# Patient Record
Sex: Female | Born: 1935 | Race: Black or African American | Hispanic: No | Smoking: Never smoker
Health system: Southern US, Community
[De-identification: ages and names within clinical notes are randomized; demographics above are authoritative.]

## PROBLEM LIST (undated history)

## (undated) DIAGNOSIS — I10 Essential (primary) hypertension: Secondary | ICD-10-CM

## (undated) DIAGNOSIS — Z972 Presence of dental prosthetic device (complete) (partial): Secondary | ICD-10-CM

## (undated) DIAGNOSIS — M1990 Unspecified osteoarthritis, unspecified site: Secondary | ICD-10-CM

## (undated) DIAGNOSIS — E785 Hyperlipidemia, unspecified: Secondary | ICD-10-CM

## (undated) DIAGNOSIS — R42 Dizziness and giddiness: Secondary | ICD-10-CM

## (undated) HISTORY — DX: Hyperlipidemia, unspecified: E78.5

## (undated) HISTORY — DX: Essential (primary) hypertension: I10

---

## 1989-01-01 HISTORY — PX: VAGINAL HYSTERECTOMY: SUR661

## 2004-04-15 ENCOUNTER — Other Ambulatory Visit: Payer: Self-pay

## 2004-04-15 ENCOUNTER — Emergency Department: Payer: Self-pay | Admitting: Emergency Medicine

## 2006-01-22 ENCOUNTER — Ambulatory Visit: Payer: Self-pay | Admitting: Internal Medicine

## 2006-03-19 ENCOUNTER — Emergency Department: Payer: Self-pay | Admitting: Emergency Medicine

## 2006-03-20 ENCOUNTER — Other Ambulatory Visit: Payer: Self-pay

## 2006-05-21 ENCOUNTER — Ambulatory Visit: Payer: Self-pay | Admitting: Family Medicine

## 2006-05-22 ENCOUNTER — Ambulatory Visit: Payer: Self-pay | Admitting: Family Medicine

## 2006-07-19 ENCOUNTER — Other Ambulatory Visit: Payer: Self-pay

## 2006-07-19 ENCOUNTER — Emergency Department: Payer: Self-pay | Admitting: Emergency Medicine

## 2007-01-07 ENCOUNTER — Ambulatory Visit: Payer: Self-pay | Admitting: Gastroenterology

## 2007-07-22 ENCOUNTER — Ambulatory Visit: Payer: Self-pay | Admitting: Internal Medicine

## 2007-10-22 ENCOUNTER — Emergency Department: Payer: Self-pay | Admitting: Emergency Medicine

## 2009-01-01 HISTORY — PX: BREAST SURGERY: SHX581

## 2009-05-22 ENCOUNTER — Emergency Department: Payer: Self-pay | Admitting: Emergency Medicine

## 2009-05-26 ENCOUNTER — Ambulatory Visit: Payer: Self-pay | Admitting: Internal Medicine

## 2011-08-21 ENCOUNTER — Emergency Department: Payer: Self-pay | Admitting: Emergency Medicine

## 2011-08-21 LAB — COMPREHENSIVE METABOLIC PANEL
Albumin: 4 g/dL (ref 3.4–5.0)
Alkaline Phosphatase: 101 U/L (ref 50–136)
Anion Gap: 6 — ABNORMAL LOW (ref 7–16)
BUN: 15 mg/dL (ref 7–18)
Calcium, Total: 9.5 mg/dL (ref 8.5–10.1)
Co2: 31 mmol/L (ref 21–32)
Creatinine: 0.85 mg/dL (ref 0.60–1.30)
EGFR (Non-African Amer.): >60
Glucose: 105 mg/dL — ABNORMAL HIGH (ref 65–99)
Osmolality: 281 (ref 275–301)
SGPT (ALT): 32 U/L (ref 12–78)
Sodium: 140 mmol/L (ref 136–145)
Total Protein: 8.8 g/dL — ABNORMAL HIGH (ref 6.4–8.2)

## 2011-08-21 LAB — URINALYSIS, COMPLETE
Bilirubin,UR: NEGATIVE
Blood: NEGATIVE
Glucose,UR: NEGATIVE mg/dL (ref 0–75)
Nitrite: NEGATIVE
RBC,UR: NONE SEEN /HPF (ref 0–5)
Squamous Epithelial: <1

## 2011-08-21 LAB — CBC
HCT: 38.3 % (ref 35.0–47.0)
HGB: 13.3 g/dL (ref 12.0–16.0)
MCH: 33.5 pg (ref 26.0–34.0)
MCHC: 34.7 g/dL (ref 32.0–36.0)
MCV: 97 fL (ref 80–100)
RDW: 13.5 % (ref 11.5–14.5)

## 2011-08-21 LAB — TROPONIN I: Troponin-I: <0.02 ng/mL

## 2011-08-21 LAB — CK TOTAL AND CKMB (NOT AT ARMC)
CK, Total: 375 U/L — ABNORMAL HIGH (ref 21–215)
CK-MB: 4.1 ng/mL — ABNORMAL HIGH (ref 0.5–3.6)

## 2012-04-02 ENCOUNTER — Ambulatory Visit: Payer: Self-pay | Admitting: Gastroenterology

## 2012-06-06 ENCOUNTER — Emergency Department: Payer: Self-pay | Admitting: Emergency Medicine

## 2012-06-20 ENCOUNTER — Ambulatory Visit: Payer: Self-pay | Admitting: Internal Medicine

## 2012-12-17 ENCOUNTER — Emergency Department: Payer: Self-pay | Admitting: Emergency Medicine

## 2012-12-17 LAB — CBC
HCT: 39.4 % (ref 35.0–47.0)
HGB: 13.2 g/dL (ref 12.0–16.0)
MCHC: 33.6 g/dL (ref 32.0–36.0)
MCV: 93 fL (ref 80–100)
Platelet: 165 10*3/uL (ref 150–440)
RDW: 14 % (ref 11.5–14.5)
WBC: 11.1 10*3/uL — ABNORMAL HIGH (ref 3.6–11.0)

## 2012-12-18 LAB — BASIC METABOLIC PANEL
BUN: 22 mg/dL — ABNORMAL HIGH (ref 7–18)
Calcium, Total: 9.7 mg/dL (ref 8.5–10.1)
Co2: 27 mmol/L (ref 21–32)
EGFR (African American): >60
EGFR (Non-African Amer.): >60
Osmolality: 275 (ref 275–301)
Potassium: 3.9 mmol/L (ref 3.5–5.1)

## 2012-12-18 LAB — TROPONIN I: Troponin-I: <0.02 ng/mL

## 2012-12-23 ENCOUNTER — Ambulatory Visit: Payer: Self-pay | Admitting: Internal Medicine

## 2013-04-08 DIAGNOSIS — M1990 Unspecified osteoarthritis, unspecified site: Secondary | ICD-10-CM | POA: Insufficient documentation

## 2013-06-18 ENCOUNTER — Ambulatory Visit: Payer: Self-pay | Admitting: Internal Medicine

## 2014-01-06 ENCOUNTER — Ambulatory Visit: Payer: Self-pay | Admitting: Internal Medicine

## 2014-07-08 ENCOUNTER — Encounter: Payer: Self-pay | Admitting: Physical Therapy

## 2014-07-08 ENCOUNTER — Ambulatory Visit: Payer: Medicare Other | Attending: Obstetrics and Gynecology | Admitting: Physical Therapy

## 2014-07-08 VITALS — BP 120/70

## 2014-07-08 DIAGNOSIS — M629 Disorder of muscle, unspecified: Secondary | ICD-10-CM | POA: Diagnosis not present

## 2014-07-08 DIAGNOSIS — R293 Abnormal posture: Secondary | ICD-10-CM | POA: Insufficient documentation

## 2014-07-08 DIAGNOSIS — R279 Unspecified lack of coordination: Secondary | ICD-10-CM | POA: Diagnosis not present

## 2014-07-08 DIAGNOSIS — R102 Pelvic and perineal pain: Secondary | ICD-10-CM | POA: Insufficient documentation

## 2014-07-08 NOTE — Patient Instructions (Signed)
Proper sitting mechanics Further education on decreasing downward pressure of pelvic floor mm postponed to next session due to pt needing to leave early for husband's appt.

## 2014-07-09 NOTE — Therapy (Deleted)
[place] [facility] MAIN [facility] SERVICES [address] [place], [place], [address] Phone: [phone]   Fax:  [phone]  Physical Therapy Evaluation  Patient Details  Name: Kathy Morgan MRN: [id number] Date of Birth: 08-Oct-1934 Referring Provider:  Christeen Douglas, MD  Encounter Date: 07/08/2014      PT End of Session - 07/09/14 2245    Visit Number 1   Number of Visits 12   Date for PT Re-Evaluation 09/30/14   Authorization Type G code at 10th visit   PT Start Time 1410   PT Stop Time 1450   PT Time Calculation (min) 40 min   Activity Tolerance Other (comment)  no hands on assessment/Tx performed due to pt needing to leave early      Past Medical History  Diagnosis Date  . Hypertension   . Hyperlipidemia     Past Surgical History  Procedure Laterality Date  . Vaginal hysterectomy  1991  . Breast surgery Right 2011    R cyst removal (benign)     Filed Vitals:   07/08/14 1425  BP: 120/70    Visit Diagnosis:  Fascial defect - Plan: PT plan of care cert/re-cert  Lack of coordination - Plan: PT plan of care cert/re-cert  Poor posture - Plan: PT plan of care cert/re-cert      Subjective Assessment - 07/09/14 2236    Subjective Pt reported 2-3 months ago, she noticed pressure and uncomfortable sensation in suprapubic area, nocturia occuring 3x/night. Pt denied wearing pads nor leakage. Pt originally thought her nocturia was due to the side effects of her water pill that she takes for hypertension but she found the Sx to worsen. Pt is able delay urination for 5 min. Pt also experiences LBP. Pt plans to return to walking at mall daily (4-5 laps). Pt declined internal assessment of pelvic floor today but would agree to do it at next visit. Pt also expressed she had to end her session early due to the need to get to her husband's appt.     Patient is accompained by: Family member  husband   Pertinent History Hx of vaginal delivery,  unknown whether she had epsiotomy. Hemorrhoids after delivery of son. Daily bowel movements 2x. day,  Bristol Scale Type 3 with straining.  fluid: (3) 8 fl oz/ day, (2) cups of decaffeinated tea/ week. Hobbies: latin dancing   Patient Stated Goals stop the prolapse from getting worse and not to feel the "annoyance"     Pain Onset Other (comment)  ~80 yo on and off             [facility] PT Assessment - 07/09/14 2239    Assessment   Medical Diagnosis pelvic pain/ nocturia   Precautions   Precautions None   Restrictions   Weight Bearing Restrictions No   Observation/Other Assessments   Observations posterior tilt of pelvis, slight thoracic kyphosis  wearing flip flops w/ medial collapse of arches   Other Surveys  --  ODI and PFDI given. Pt had to leave early/ will return   Functional Tests   Functional tests --  pushing husband w/ WC w/ spinal flexion/hunched shoulders   Posture/Postural Control   Posture/Postural Control --  slumped sitting, ankles crossed under chair                 Pelvic Floor Special Questions - 07/09/14 2242    External Perineal Exam deferred for next visit due to pt needing to leave early  Pelvic Floor Internal Exam deferred for next visit due to pt needing to leave early and per pt's request          [facility] Adult PT Treatment/Exercise - 07[facility]08[facility]16 2242    Neuro Re-ed    Neuro Re-ed Details  proper sitting posture                 PT Education - 07/09/14 2244    Education provided Yes   Education Details sitting mechanics, anatomy and physiology, ways to decrease downward pressure of pelvic floor, goals, POC   Person(s) Educated Patient;Spouse   Methods Explanation;Demonstration;Tactile cues;Verbal cues   Comprehension Verbalized understanding;Returned demonstration             PT Long Term Goals - 07/09/14 2248    PT LONG TERM GOAL #1   Title Pt will report across a month her suprapubic pressure sensation will decrease from 4  x/month to < 2x month to increase QOL.    Time 12   Period Weeks   Status New   PT LONG TERM GOAL #2   Title Pt report decreased back pain from 25% to < 20% after bending over and picking up her purse from the floor 5 reps in order to increase particiaption w. ADLs.    Time 12   Period Weeks   Status New   PT LONG TERM GOAL #3   Title Pt will report decreased notcuria from 3x/night to < 2 x/ night in order to improve her sleep quality.    Time 12   Period Weeks   Status New   PT LONG TERM GOAL #4   Title Pt will report having bowel consistency of Bristol Scale Stool Type 3-4 with decreased straining > 75% of the time in order to perserve health/function of abdominopelvic area.    Time 12   Period Weeks   Status New               Plan - 07/09/14 2247    Clinical Impression Statement Pt is a 80 yo female whose S & Sx consist of posterior tilt of pelvis, slight thoracic kyphosis, poor sitting posture, Hx of straining w/bowel movements w/ harden stools, c/o of suprapubic pressure, LBP, and nocturia of 3x/night. These deficits interfere with her sleep quality and ability to bend over and pick items off floor.      Pt will benefit from skilled therapeutic intervention in order to improve on the following deficits Abnormal gait;Difficulty walking;Decreased range of motion;Decreased coordination;Decreased balance;Decreased activity tolerance;Decreased safety awareness;Decreased endurance;Pain;Impaired flexibility;Improper body mechanics;Decreased strength;Decreased mobility;Postural dysfunction;Increased muscle spasms   Rehab Potential Good   Clinical Impairments Affecting Rehab Potential age   PT Frequency 1x / week   PT Duration 12 weeks   PT Treatment/Interventions ADLs/Self Care Home Management;Moist Heat;Traction;Aquatic Therapy;Biofeedback;Electrical Stimulation;Cryotherapy;Stair training;Gait training;Therapeutic activities;Therapeutic exercise;Dry needling;Energy  conservation;Taping;Manual techniques;Neuromuscular re-education;Patient/family education;Functional mobility training;Passive range of motion;Balance training   PT Next Visit Plan spinal, abdominal , and pelvic internal assessment    Recommended Other Services Provided nutritional medical therapy resource [facility] to day to promote wellness and better management of high cholesterol/ blood pressure   Consulted and Agree with Plan of Care Patient;Family member/caregiver   Family Member Consulted husband          G-Codes - 07/09/14 2256    Functional Assessment Tool Used subjective history   Functional Limitation Self care;Mobility: Walking and moving around   Mobility: Walking and Moving Around Current Status [phone]([id number]) At least  20 percent but less than 40 percent impaired, limited or restricted   Mobility: Walking and Moving Around Goal Status [phone]) At least 1 percent but less than 20 percent impaired, limited or restricted   Self Care Current Status (X5284) At least 40 percent but less than 60 percent impaired, limited or restricted   Self Care Goal Status (X3244) At least 20 percent but less than 40 percent impaired, limited or restricted       Problem List There are no active problems to display for this patient.   Mariane Masters ,PT, DPT, E-RYT  07/09/2014, 11:00 PM  [place] [facility] MAIN [facility] SERVICES [address] [place], [place], [address] Phone: [phone]   Fax:  [phone]
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
## 2014-07-20 ENCOUNTER — Ambulatory Visit: Payer: Medicare Other | Admitting: Physical Therapy

## 2014-07-20 DIAGNOSIS — M629 Disorder of muscle, unspecified: Secondary | ICD-10-CM

## 2014-07-20 DIAGNOSIS — R293 Abnormal posture: Secondary | ICD-10-CM

## 2014-07-20 DIAGNOSIS — R279 Unspecified lack of coordination: Secondary | ICD-10-CM

## 2014-07-20 DIAGNOSIS — R102 Pelvic and perineal pain: Secondary | ICD-10-CM | POA: Diagnosis not present

## 2014-07-21 NOTE — Therapy (Deleted)
[facility] [facility] [address] [place], [place], [address] Phone: [phone]   Fax:  [phone]  Patient Details  Name: Kathy SalisburyRuby Morgan MRN: [id number] Date of Birth: 1934/04/16 Referring Provider:  Christeen DouglasBeasley, Bethany, MD  Encounter Date: 07/20/2014   Mariane MastersYeung,Shin Yiing 07/21/2014, 8:01 AM  [place] [facility] [address] [place], [place], [address] Phone: [phone]   Fax:  [phone]

## 2014-07-21 NOTE — Patient Instructions (Addendum)
   Hand out for log roll technique, Feet propped when toileting and breathing no straining to decrease worsening of prolapse  Pelvic floor long holds 5 sec,, count aloud, 3 breath rest break, 5x/ day with pillow under hips

## 2014-07-21 NOTE — Therapy (Signed)
[facility] MAIN [facility] SERVICES [address] [place], [place], [address] Phone: [phone]   Fax:  [phone]  Physical Therapy Treatment  Patient Details  Name: Angeleigh Chiasson MRN: [id number] Date of Birth: 1934-01-31 Referring Provider:  Christeen Douglas, MD  Encounter Date: 07/20/2014      PT End of Session - 07/21/14 0757    Visit Number 2   Number of Visits 12   Date for PT Re-Evaluation 09/30/14   Authorization Type G code at 10th visit   PT Start Time 0917   PT Stop Time 0950   PT Time Calculation (min) 33 min   Activity Tolerance Other (comment);Patient tolerated treatment well;No increased pain  no hands on assessment/Tx performed due to pt needing to leave early   Behavior During Therapy [facility] for tasks assessed/performed      Past Medical History  Diagnosis Date  . Hypertension   . Hyperlipidemia     Past Surgical History  Procedure Laterality Date  . Vaginal hysterectomy  1991  . Breast surgery Right 2011    R cyst removal (benign)     There were no vitals filed for this visit.  Visit Diagnosis:  Fascial defect  Lack of coordination  Poor posture      Subjective Assessment - 07/21/14 0755    Subjective Pt reported her suprapubic discomfort did not bother her after leaving last session. Her back has been hurting her. Pt stated she had to leave 15 min early today.    Pertinent History Hx of vaginal delivery, unknown whether she had epsiotomy. Hemorrhoids after delivery of son. Daily bowel movements 2x. day,  Bristol Scale Type 3 with straining.  fluid: (3) 8 fl oz/ day, (2) cups of decaffeinated tea/ week. Hobbies: latin dancing   Patient Stated Goals stop the prolapse from getting worse and not to feel the "annoyance"     Pain Onset Today  ~80 yo on and off             [facility] PT Assessment - 07/21/14 0752    Posture/Postural Control   Posture Comments noted bearing down w/ cue for bowel movement, pelvic  instability w/ le movements in supine   Palpation   Palpation comment soft abdomen, lacking tensigrity   Bed Mobility   Bed Mobility --  half crunch for OOB, cued for log roll                  Pelvic Floor Special Questions - 07/21/14 0748    Exam Type Vaginal  verbally consented w/o contraindications   Palpation observed abnormal position of bladder within introitus  required pillow for circumferential contraction   Strength # of reps 5   Strength # of seconds 5   Biofeedback required excessive coordination training, initially contracted w/ inhalation, valsalva  adductor overuse           [facility] Adult PT Treatment/Exercise - 07/21/14 4270    Therapeutic Activites    ADL's proper toileting posture to decrease straining   Neuro Re-ed    Neuro Re-ed Details  pelvic floor coordination for long holds   5 reps, 5 sec with 3 breath rest breaks, pillow under hips                PT Education - 07/21/14 0755    Education provided Yes   Education Details HEP   Person(s) Educated Patient   Methods Explanation;Demonstration;Tactile cues;Verbal cues;Handout   Comprehension Verbalized understanding;Returned demonstration  PT Long Term Goals - 07/09/14 2248    PT LONG TERM GOAL #1   Title Pt will report across a month her suprapubic pressure sensation will decrease from 4 x/month to < 2x month to increase QOL.    Time 12   Period Weeks   Status New   PT LONG TERM GOAL #2   Title Pt report decreased back pain from 25% to < 20% after bending over and picking up grocery bag of 8#  from the floor 5 reps in order to increase particiaption w. ADLs.    Time 12   Period Weeks   Status New   PT LONG TERM GOAL #3   Title Pt will report decreased nocturia from 3x/night to < 2 x/ night in order to improve her sleep quality.    Time 12   Period Weeks   Status New   PT LONG TERM GOAL #4   Title Pt will report having bowel consistency of Bristol Scale  Stool Type 3-4 with decreased straining > 75% of the time in order to perserve health/function of abdominopelvic area.    Time 12   Period Weeks   Status New   PT LONG TERM GOAL #5   Title Pt will demo deep core activation with floor to rise (low lunge) and UE support 3 reps without cuing in order to demo improved balance and functional mobility/strength.   Time 12   Period Weeks   Status New               Plan - 07/21/14 0757    Clinical Impression Statement Despite abbreviated session today due to pt's late arrival and need to leave early, pt was able to demo proper pelvic floor coordination for 5 hold holds, 5 reps at 3/5 strength with pillow propped to faciliate better positioning of pelvic organs. Reviewed ways to decrease downward pressure on pelvic floor. Plan to address deep core strengthening/ pelvic tilts to address back pain at next session.     Pt will benefit from skilled therapeutic intervention in order to improve on the following deficits Abnormal gait;Difficulty walking;Decreased range of motion;Decreased coordination;Decreased balance;Decreased activity tolerance;Decreased safety awareness;Decreased endurance;Pain;Impaired flexibility;Improper body mechanics;Decreased strength;Decreased mobility;Postural dysfunction;Increased muscle spasms   Rehab Potential Good   Clinical Impairments Affecting Rehab Potential age   PT Frequency 1x / week   PT Duration 12 weeks   PT Treatment/Interventions ADLs/Self Care Home Management;Moist Heat;Traction;Aquatic Therapy;Biofeedback;Electrical Stimulation;Cryotherapy;Stair training;Gait training;Therapeutic activities;Therapeutic exercise;Dry needling;Energy conservation;Taping;Manual techniques;Neuromuscular re-education;Patient/family education;Functional mobility training;Passive range of motion;Balance training   PT Next Visit Plan deep core strengthening and abdominal massage    Consulted and Agree with Plan of Care Patient         Problem List There are no active problems to display for this patient.   Mariane MastersYeung,Shin Yiing  ,PT, DPT, E-RYT  07/21/2014, 8:02 AM  [place] [facility] [address] [place], [place], [address] Phone: [phone]   Fax:  [phone]

## 2014-07-27 ENCOUNTER — Ambulatory Visit: Payer: Medicare Other | Admitting: Physical Therapy

## 2014-07-27 DIAGNOSIS — R102 Pelvic and perineal pain: Secondary | ICD-10-CM | POA: Diagnosis not present

## 2014-07-27 DIAGNOSIS — M629 Disorder of muscle, unspecified: Secondary | ICD-10-CM

## 2014-07-27 DIAGNOSIS — R279 Unspecified lack of coordination: Secondary | ICD-10-CM

## 2014-07-27 DIAGNOSIS — R293 Abnormal posture: Secondary | ICD-10-CM

## 2014-07-28 NOTE — Therapy (Signed)
[place] MAIN [facility] SERVICES [address] [place], [place], [address] Phone: [phone]   Fax:  [phone]  Physical Therapy Treatment  Patient Details  Name: Kathy Morgan MRN: [id number] Date of Birth: [birth year] Referring Provider:  Benjaman Kindler, MD  Encounter Date: 07/27/2014      PT End of Session - 07/28/14 1344    Visit Number 2   Number of Visits 12   Date for PT Re-Evaluation 09/30/14   Authorization Type G code at 10th visit   PT Start Time 0911   PT Stop Time 1000   PT Time Calculation (min) 49 min   Activity Tolerance Other (comment);Patient tolerated treatment well;No increased pain  no hands on assessment/Tx performed due to pt needing to leave early   Behavior During Therapy [facility] for tasks assessed/performed      Past Medical History  Diagnosis Date  . Hypertension   . Hyperlipidemia     Past Surgical History  Procedure Laterality Date  . Vaginal hysterectomy  1991  . Breast surgery Right 2011    R cyst removal (benign)     There were no vitals filed for this visit.  Visit Diagnosis:  Fascial defect  Lack of coordination  Poor posture      Subjective Assessment - 07/27/14 0914    Subjective Pt felt her back has been good except for one day.  Pt's pelvic pain has resolved for the past week and has not been waking her up at night nor lingering through the day. She states it is not as bad. Pt feels better overall and finds herself "being able to get through the day."    Pertinent History Hx of vaginal delivery, unknown whether she had epsiotomy. Hemorrhoids after delivery of son. Daily bowel movements 2x. day,  Bristol Scale Type 3 with straining.  fluid: (3) 8 fl oz/ day, (2) cups of decaffeinated tea/ week. Hobbies: latin dancing   Patient Stated Goals stop the prolapse from getting worse and not to feel the "annoyance"     Currently in Pain? No/denies   Pain Location Back   Pain Onset Today  ~80 yo  on and off                       Pelvic Floor Special Questions - 07/27/14 1005    Exam Type Vaginal  verbally consented w/o contraindications   Palpation --  circumferential contraction w/o pillow under  hips   Strength # of reps 3   Strength # of seconds 7   Biofeedback improved coordination compared to last visit. decreased cuing           [facility] Adult PT Treatment/Exercise - 07/28/14 1340    Neuro Re-ed    Neuro Re-ed Details  dynamic stabilization 1-2, 10 reps  pillow under hips   Exercises   Exercises --  Pelvic floor holds 7 sec, 3 reps pillow under hips                PT Education - 07/28/14 1342    Education provided Yes   Education Details HEP, progression of POC   Person(s) Educated Patient   Methods Explanation;Demonstration;Tactile cues;Verbal cues   Comprehension Verbalized understanding;Returned demonstration             PT Long Term Goals - 07/28/14 1343    PT LONG TERM GOAL #1   Title Pt will report across a month her suprapubic pressure sensation will  decrease from 4 x/month to < 2x month to increase QOL.    Time 12   Period Weeks   Status Partially Met   PT LONG TERM GOAL #2   Title Pt report decreased back pain from 25% to < 20% after bending over and picking up grocery bag of 8#  from the floor 5 reps in order to increase particiaption w. ADLs.    Time 12   Period Weeks   Status On-going   PT LONG TERM GOAL #3   Title Pt will report decreased nocturia from 3x/night to < 2 x/ night in order to improve her sleep quality.    Time 12   Period Weeks   Status On-going   PT LONG TERM GOAL #4   Title Pt will report having bowel consistency of Bristol Scale Stool Type 3-4 with decreased straining > 75% of the time in order to perserve health/function of abdominopelvic area.    Time 12   Period Weeks   Status On-going   PT LONG TERM GOAL #5   Title Pt will demo deep core activation with floor to rise (low lunge) and UE  support 3 reps without cuing in order to demo improved balance and functional mobility/strength.   Time 12   Period Weeks   Status New               Plan - 07/28/14 1354    Clinical Impression Statement Pt continues to progress well with resolution of pelvic discomfort for the past week.  Pt showed increased pelvic floor strength as she demo'd ability to perform circumferential contraction without a pillow under her hips today. Pt has progressed to dynamic stabiliazation 1-2 and longer endurance pelvic floor holds. Pt will continue to benefit from skilled PT.   Pt will benefit from skilled therapeutic intervention in order to improve on the following deficits Abnormal gait;Difficulty walking;Decreased range of motion;Decreased coordination;Decreased balance;Decreased activity tolerance;Decreased safety awareness;Decreased endurance;Pain;Impaired flexibility;Improper body mechanics;Decreased strength;Decreased mobility;Postural dysfunction;Increased muscle spasms   Rehab Potential Good   Clinical Impairments Affecting Rehab Potential age   PT Frequency 1x / week   PT Duration 12 weeks   PT Treatment/Interventions ADLs/Self Care Home Management;Moist Heat;Traction;Aquatic Therapy;Biofeedback;Electrical Stimulation;Cryotherapy;Stair training;Gait training;Therapeutic activities;Therapeutic exercise;Dry needling;Energy conservation;Taping;Manual techniques;Neuromuscular re-education;Patient/family education;Functional mobility training;Passive range of motion;Balance training   PT Next Visit Plan abdominal massage   Consulted and Agree with Plan of Care Patient        Problem List There are no active problems to display for this patient.   Sara Chu, DPT, E-RYT   07/28/2014, 1:59 PM  [place] [facility] MAIN [facility] SERVICES [address] [place], [place], [address] Phone: [phone]   Fax:  [phone]

## 2014-07-28 NOTE — Patient Instructions (Signed)
Pelvic floor -7 sec, 3reps, 7 x/ day  Perform Dynamic 1-2 this week.  You are now ready to begin training the deep core muscles system: diaphragm, transverse abdominis, pelvic floor . These muscles must work together as a team.           The key to these exercises to train the brain to coordinate the timing of these muscles and to have them turn on for long periods of time to hold you upright against gravity (especially important if you are on your feet all day).These muscles are postural muscles and play a role stabilizing your spine and bodyweight. By doing these repetitions slowly and correctly instead of doing crunches, you will achieve a flatter belly without a lower pooch. You are also placing your spine in a more neutral position and breathing properly which in turn, decreases your risk for problems related to your pelvic floor, abdominal, and low back such as pelvic organ prolapse, hernias, diastasis recti (separation of superficial muscles), disk herniations, spinal fractures. These exercises set a solid foundation for you to later progress to resistance/ strength training with therabands and weights and return to other typical fitness exercises with a stronger deeper core.

## 2014-08-03 ENCOUNTER — Ambulatory Visit: Payer: Medicare Other | Attending: Obstetrics and Gynecology | Admitting: Physical Therapy

## 2014-08-03 ENCOUNTER — Encounter: Payer: Medicare Other | Admitting: Physical Therapy

## 2014-08-03 VITALS — BP 110/72

## 2014-08-03 DIAGNOSIS — R293 Abnormal posture: Secondary | ICD-10-CM

## 2014-08-03 DIAGNOSIS — R279 Unspecified lack of coordination: Secondary | ICD-10-CM | POA: Diagnosis present

## 2014-08-03 DIAGNOSIS — M629 Disorder of muscle, unspecified: Secondary | ICD-10-CM | POA: Insufficient documentation

## 2014-08-03 NOTE — Patient Instructions (Signed)
Open book  Standing by wall, elbows pushing into wall (shoulder retraction) 5 sec w/ pelvic floor lift  Seated, shoulder away from ears, elbows straight, palms into seat )shoulder retraction 5 sec w/ pelvic floor lift  Pelvic floor quick  10x/ 3 sets/ day for all the above

## 2014-08-04 ENCOUNTER — Encounter: Payer: Medicare Other | Admitting: Physical Therapy

## 2014-08-04 NOTE — Therapy (Signed)
[place] MAIN [facility] SERVICES [address] [place], [place], [address] Phone: [phone]   Fax:  [phone]  Physical Therapy Treatment  Patient Details  Name: Kathy Morgan MRN: [id number] Date of Birth: 08-18-1934 Referring Provider:  Benjaman Kindler, MD  Encounter Date: 08/03/2014      PT End of Session - 08/03/14 1015    Visit Number 3   Number of Visits 12   Date for PT Re-Evaluation 09/30/14   Authorization Type G code at 10th visit   PT Start Time 0900   PT Stop Time 1008   PT Time Calculation (min) 68 min   Activity Tolerance Other (comment);Patient tolerated treatment well;No increased pain  no hands on assessment/Tx performed due to pt needing to leave early   Behavior During Therapy [facility] for tasks assessed/performed      Past Medical History  Diagnosis Date  . Hypertension   . Hyperlipidemia     Past Surgical History  Procedure Laterality Date  . Vaginal hysterectomy  1991  . Breast surgery Right 2011    R cyst removal (benign)     Filed Vitals:   08/03/14 0927  BP: 110/72    Visit Diagnosis:  No diagnosis found.      Subjective Assessment - 08/03/14 0907    Subjective Pt reported she feeling "fine" with pelvic area for the past 2 weeks with 90% improvement. Pt states 85-90% improvement with her back pain. Pt states she had experienced 5/10 discomfort aching on the shoulder blade for 4 days with a 2/10 today.  Pt relieved the discomfort with icy hot. Pt staes she has been experiencing stress in her life as her husband and sister-in-law have diagnoses of a life-threatening disease. Pt denied jaw/hand pain , chest pain, nuimbness /tingling down arm, SOB.  Pt has been  lifting bags and noticed some strain. Pt also sometimes  pushes her husband in a WC and has to transfer the [facility] out of the car.     Pertinent History Hx of vaginal delivery, unknown whether she had epsiotomy. Hemorrhoids after delivery of son. Daily  bowel movements 2x. day,  Bristol Scale Type 3 with straining.  fluid: (3) 8 fl oz/ day, (2) cups of decaffeinated tea/ week. Hobbies: latin dancing   Patient Stated Goals stop the prolapse from getting worse and not to feel the "annoyance"     Currently in Pain? Yes   Pain Score 2    Pain Location Shoulder   Pain Orientation Left   Pain Descriptors / Indicators Aching   Pain Onset Today  ~80 yo on and off                          [facility] Adult PT Treatment/Exercise - 08/04/14 0909    Posture/Postural Control   Posture Comments cervical/scap retraction by wall (elbow press), and seated w/ shoulder depression on seat, with deep core activation 10 reps   seated pelvic floor quick holds 5x, 5 x/ day   Self-Care   Other Self-Care Comments  body scan    Neuro Re-ed    Neuro Re-ed Details  cue for depression of ribcage w/ exhalation, less    Exercises   Exercises --  open book bilateral   Manual Therapy   Joint Mobilization Grade II T7, 9-12 90 sec bout. Increased mobility post-Tx   Soft tissue mobilization R middle thoracic mm > L, upper trap mm   decreased tensions/  tenderness post Tx   Myofascial Release bilateral pects   Scapular Mobilization bilateral in sidelying                PT Education - 08/04/14 1005    Education provided Yes   Education Details HEP, use of body scan for stress management    Person(s) Educated Patient   Methods Explanation;Demonstration;Tactile cues;Verbal cues;Handout   Comprehension Verbalized understanding;Returned demonstration             PT Long Term Goals - 07/28/14 1343    PT LONG TERM GOAL #1   Title Pt will report across a month her suprapubic pressure sensation will decrease from 4 x/month to < 2x month to increase QOL.    Time 12   Period Weeks   Status Partially Met   PT LONG TERM GOAL #2   Title Pt report decreased back pain from 25% to < 20% after bending over and picking up grocery bag of 8#  from the  floor 5 reps in order to increase particiaption w. ADLs.    Time 12   Period Weeks   Status On-going   PT LONG TERM GOAL #3   Title Pt will report decreased nocturia from 3x/night to < 2 x/ night in order to improve her sleep quality.    Time 12   Period Weeks   Status On-going   PT LONG TERM GOAL #4   Title Pt will report having bowel consistency of Bristol Scale Stool Type 3-4 with decreased straining > 75% of the time in order to perserve health/function of abdominopelvic area.    Time 12   Period Weeks   Status On-going   PT LONG TERM GOAL #5   Title Pt will demo deep core activation with floor to rise (low lunge) and UE support 3 reps without cuing in order to demo improved balance and functional mobility/strength.   Time 12   Period Weeks   Status New               Plan - 08/04/14 1008    Clinical Impression Statement Pt continues to progress well with no pain post-Tx. Pt's mm imbalances in her midback/ upper traps are assoicated with her tendency to elevate her upper traps w/ breathing and slumped posture. Pdemo'd improved deep core coordination w/ posture training/HEP. Pt will continue to benefit from skilled PT to address deep core strengthening and functional strengthening.     Pt will benefit from skilled therapeutic intervention in order to improve on the following deficits Abnormal gait;Difficulty walking;Decreased range of motion;Decreased coordination;Decreased balance;Decreased activity tolerance;Decreased safety awareness;Decreased endurance;Pain;Impaired flexibility;Improper body mechanics;Decreased strength;Decreased mobility;Postural dysfunction;Increased muscle spasms   Rehab Potential Good   Clinical Impairments Affecting Rehab Potential age   PT Frequency 1x / week   PT Duration 12 weeks   PT Treatment/Interventions ADLs/Self Care Home Management;Moist Heat;Traction;Aquatic Therapy;Biofeedback;Electrical Stimulation;Cryotherapy;Stair training;Gait  training;Therapeutic activities;Therapeutic exercise;Dry needling;Energy conservation;Taping;Manual techniques;Neuromuscular re-education;Patient/family education;Functional mobility training;Passive range of motion;Balance training   PT Next Visit Plan abdominal massage, WC t/f,band exercises   Consulted and Agree with Plan of Care Patient        Problem List There are no active problems to display for this patient.   Jerl Mina ,PT, DPT, E-RYT  08/04/2014, 11:56 AM  [place] MAIN [facility] SERVICES [address] [place], [place], [address] Phone: [phone]   Fax:  [phone]

## 2014-08-10 ENCOUNTER — Encounter: Payer: Medicare Other | Admitting: Physical Therapy

## 2014-08-11 ENCOUNTER — Ambulatory Visit: Payer: Medicare Other | Admitting: Physical Therapy

## 2014-08-11 DIAGNOSIS — R293 Abnormal posture: Secondary | ICD-10-CM

## 2014-08-11 DIAGNOSIS — M629 Disorder of muscle, unspecified: Secondary | ICD-10-CM | POA: Diagnosis not present

## 2014-08-11 DIAGNOSIS — R279 Unspecified lack of coordination: Secondary | ICD-10-CM

## 2014-08-11 NOTE — Patient Instructions (Addendum)
Strengthening routine:  Start in proper sitting posture (slight arch back, feet on floor, weight on sitting bones not tailbone)           Seated pelvic floor 7 sec holds, x 5 reps, 2 sets at 3 x during the day (8:00-noon, noon-4pm, 4-8pm) == total 30 sets/ day  Laying on back with knees bent 10x 2 sets    1. Dynamic Stabilization 3: marching  2. Dynamic Stabilization 4: e leg straightens with opposite arm overhead 10x 3 sets   3. Yellow band under heels, elbows down dig into bed, shoulder blades back, on exhale, pull band into "w"    In hallway:  2-3 roundtrips   Side step > squat (see your knees) > inhale on exhale you stand > elbow by ribs and exhale with pulling apart  Yellow band   [place]: moving to right, left foot circle front, sidestep, left foot circles back behind...the whole hallway                    Move left with right foot circles infront and the side step and right foot behind....    To relieve the back, small pelvic tilts to feel the closure of the joint in the back

## 2014-08-12 ENCOUNTER — Encounter: Payer: Medicare Other | Admitting: Physical Therapy

## 2014-08-12 NOTE — Therapy (Signed)
[place] [facility] MAIN [facility] SERVICES [address] [place], [place], [address] Phone: [phone]   Fax:  [phone]  Physical Therapy Treatment  Patient Details  Name: Kathy Morgan MRN: [id number] Date of Birth: Oct 26, 1934 Referring Provider:  Christeen Douglas, MD  Encounter Date: 08/11/2014      PT End of Session - 08/12/14 0811    Visit Number 4   Number of Visits 12   Date for PT Re-Evaluation 09/30/14   Authorization Type G code at 10th visit   PT Start Time 1100   PT Stop Time 1200   PT Time Calculation (min) 60 min   Activity Tolerance Other (comment);Patient tolerated treatment well;No increased pain  no hands on assessment/Tx performed due to pt needing to leave early   Behavior During Therapy [facility] for tasks assessed/performed      Past Medical History  Diagnosis Date  . Hypertension   . Hyperlipidemia     Past Surgical History  Procedure Laterality Date  . Vaginal hysterectomy  1991  . Breast surgery Right 2011    R cyst removal (benign)     There were no vitals filed for this visit.  Visit Diagnosis:  Fascial defect  Lack of coordination  Poor posture      Subjective Assessment - 08/11/14 1105    Subjective Pt reported she continued to have no discomfort all week since last session in the pelvic area. Pt was able to go on a road trip to [place], [place] with friends on a bus and enjoyed herself without experiencing any Sx. Yesterday, pt experienced minor back pain 3/10 which resolved w/ application of icey hot. This  morning pt experiences 4/10 back pain.      Pertinent History Hx of vaginal delivery, unknown whether she had epsiotomy. Hemorrhoids after delivery of son. Daily bowel movements 2x. day,  Bristol Scale Type 3 with straining.  fluid: (3) 8 fl oz/ day, (2) cups of decaffeinated tea/ week. Hobbies: latin dancing   Patient Stated Goals stop the prolapse from getting worse and not to feel the "annoyance"     Currently in Pain? Yes   Pain Score 4    Pain Location Back   Pain Onset Today  ~80 yo on and off                          [facility] Adult PT Treatment/Exercise - 08/12/14 0810    Exercises   Exercises --  see pt instructions   Manual Therapy   Joint Mobilization manually applied force closure                 PT Education - 08/12/14 0811    Education provided Yes   Education Details HEP   Person(s) Educated Patient   Methods Explanation;Demonstration;Tactile cues;Verbal cues;Handout   Comprehension Verbalized understanding;Returned demonstration             PT Long Term Goals - 08/12/14 6962    PT LONG TERM GOAL #1   Title Pt will report across a month her suprapubic pressure sensation will decrease from 4 x/month to < 2x month to increase QOL.    Time 12   Period Weeks   Status Achieved   PT LONG TERM GOAL #2   Title Pt report decreased back pain from 25% to < 20% after bending over and picking up grocery bag of 8#  from the floor 5 reps in order to increase particiaption  w. ADLs.    Time 12   Period Weeks   Status On-going   PT LONG TERM GOAL #3   Title Pt will report decreased nocturia from 3x/night to < 2 x/ night in order to improve her sleep quality.    Time 12   Period Weeks   Status On-going   PT LONG TERM GOAL #4   Title Pt will report having bowel consistency of Bristol Scale Stool Type 3-4 with decreased straining > 75% of the time in order to perserve health/function of abdominopelvic area.    Time 12   Period Weeks   Status On-going   PT LONG TERM GOAL #5   Title Pt will demo deep core activation with floor to rise (low lunge) and UE support 3 reps without cuing in order to demo improved balance and functional mobility/strength.   Time 12   Period Weeks   Status New               Plan - 08/12/14 4696    Clinical Impression Statement Pt is progress well with resolved pelvic discomfort for the past month. Pt's back  pain decreased from 4/10 to 2/10 post-Tx today that involved predominantly Ther Ex and minor manual Tx. Pt demo'd progressions of strengthening exercises correctly and will be returning in 2 weeks in order to practice at home and build strength over this time period. At her next session, plan is to focus on functional strengthening in order to address remaining goals.    Pt will benefit from skilled therapeutic intervention in order to improve on the following deficits Abnormal gait;Difficulty walking;Decreased range of motion;Decreased coordination;Decreased balance;Decreased activity tolerance;Decreased safety awareness;Decreased endurance;Pain;Impaired flexibility;Improper body mechanics;Decreased strength;Decreased mobility;Postural dysfunction;Increased muscle spasms   Rehab Potential Good   Clinical Impairments Affecting Rehab Potential age   PT Frequency 1x / week   PT Duration 12 weeks   PT Treatment/Interventions ADLs/Self Care Home Management;Moist Heat;Traction;Aquatic Therapy;Biofeedback;Electrical Stimulation;Cryotherapy;Stair training;Gait training;Therapeutic activities;Therapeutic exercise;Dry needling;Energy conservation;Taping;Manual techniques;Neuromuscular re-education;Patient/family education;Functional mobility training;Passive range of motion;Balance training   PT Next Visit Plan abdominal massage, WC t/f,band exercises   Consulted and Agree with Plan of Care Patient        Problem List There are no active problems to display for this patient.   Mariane Masters ,PT, DPT, E-RYT   08/12/2014, 8:27 AM  [place] [facility] MAIN [facility] SERVICES [address] [place], [place], [address] Phone: [phone]   Fax:  [phone]

## 2014-08-12 NOTE — Addendum Note (Signed)
Addended by: Mariane Masters on: 08/12/2014 08:37 AM   Modules accepted: Medications

## 2014-08-17 ENCOUNTER — Encounter: Payer: Medicare Other | Admitting: Physical Therapy

## 2014-08-23 ENCOUNTER — Ambulatory Visit: Payer: Medicare Other | Admitting: Physical Therapy

## 2014-08-23 DIAGNOSIS — R293 Abnormal posture: Secondary | ICD-10-CM

## 2014-08-23 DIAGNOSIS — M629 Disorder of muscle, unspecified: Secondary | ICD-10-CM

## 2014-08-23 DIAGNOSIS — R279 Unspecified lack of coordination: Secondary | ICD-10-CM

## 2014-08-23 NOTE — Patient Instructions (Addendum)
  Clam Shell 45 Degrees   Lying with hips and knees bent 45, one pillow between knees and ankles. Lift knee with exhale. Be sure pelvis does not roll backward. Do not arch back. Do 10 times, each leg, 2 times per day.  http://ss.exer.us/75   Copyright  VHI. All rights reserved.    Dynamic Stabilization 3-4    Sit-to-stand alignment and deep core engaged

## 2014-08-23 NOTE — Therapy (Signed)
[facility] MAIN [facility] SERVICES [address] [place], [place], [address] Phone: [phone]   Fax:  [phone]  Physical Therapy Treatment  Patient Details  Name: Kathy Morgan MRN: [id number] Date of Birth: 1934/01/21 Referring Provider:  Christeen Douglas, MD  Encounter Date: 08/23/2014      PT End of Session - 08/23/14 1226    Visit Number 6   Number of Visits 12   Date for PT Re-Evaluation 09/30/14   Authorization Type G code at 10th visit   Activity Tolerance Other (comment);Patient tolerated treatment well;No increased pain  no hands on assessment/Tx performed due to pt needing to leave early   Behavior During Therapy [facility] for tasks assessed/performed      Past Medical History  Diagnosis Date  . Hypertension   . Hyperlipidemia     Past Surgical History  Procedure Laterality Date  . Vaginal hysterectomy  1991  . Breast surgery Right 2011    R cyst removal (benign)     There were no vitals filed for this visit.  Visit Diagnosis:  Fascial defect  Lack of coordination  Poor posture      Subjective Assessment - 08/23/14 0911    Subjective Pt reported her LBP has improved by 85%.  Pt reported one incident with LBP/pelvic pain upon waking and it dissipated completely within a few hours without medications. Pt reported painwith R hip after  performing grapevine exercise. Pt had pain in this location before but an injection had relieved her pain for the past 2 years. Pt states icy hot packs have decreased 5/10 to 3/10.  This pain limited pt from laying her R side and has caused her to walk slower. Pt stated she stopped using the bands in HEP once the hip pain started but she did the hooklying HEP. Pt also reported decreased urinary frequency since [facility].       Pertinent History Hx of vaginal delivery, unknown whether she had epsiotomy. Hemorrhoids after delivery of son. Daily bowel movements 2x. day,  Bristol Scale Type 3 with  straining.  fluid: (3) 8 fl oz/ day, (2) cups of decaffeinated tea/ week. Hobbies: latin dancing   Patient Stated Goals stop the prolapse from getting worse and not to feel the "annoyance"     Pain Onset Today  ~80 yo on and off             [facility] PT Assessment - 08/23/14 0922    ROM / Strength   AROM / PROM / Strength --  hip abd 2/5 R, unable to perform  L sidelying. Post-Tx:3+/5    Palpation   Palpation comment increased tensions PSIS R area, sacral 2-3 levels    decreased tensions/tenderness post-Tx                     [facility] Adult PT Treatment/Exercise - 08/23/14 6962    Ambulation/Gait   Gait Comments 12.7 sec 50m = 0.7 m/s    post-Tx: 50m: 9.98 sec = 1.0 m/s    Self-Care   Other Self-Care Comments  force closure education. decreased pain w/ manually applied florce closure and increased ability to abduct hip sidelying    Therapeutic Activites    ADL's --   Neuro Re-ed    Neuro Re-ed Details  dynamic stabilization 3-4, 10 reps  sit to stand w/ alignment cues and deep core engaged   Manual Therapy   Joint Mobilization AP mobs Grade II R,   lower  trunk rotation MWM, SKC MWM   Soft tissue mobilization sustatined pressure, jostling in hooklying along R PSIS, S2-3 level                PT Education - 08/23/14 1146    Education provided Yes   Education Details HEP, pelvic girdle stability principles   Person(s) Educated Patient   Methods Explanation;Demonstration;Tactile cues;Verbal cues;Handout   Comprehension Verbalized understanding;Returned demonstration             PT Long Term Goals - 08/23/14 1145    PT LONG TERM GOAL #1   Title Pt will report across a month her suprapubic pressure sensation will decrease from 4 x/month to < 2x month to increase QOL.    Time 12   Period Weeks   Status Achieved   PT LONG TERM GOAL #2   Title Pt report decreased back pain from 25% to < 20% after bending over and picking up grocery bag of 8#  from the  floor 5 reps in order to increase particiaption w. ADLs.    Time 12   Period Weeks   Status On-going   PT LONG TERM GOAL #3   Title Pt will report decreased nocturia from 3x/night to < 2 x/ night in order to improve her sleep quality.    Time 12   Period Weeks   Status On-going   PT LONG TERM GOAL #4   Title Pt will report having bowel consistency of Bristol Scale Stool Type 3-4 with decreased straining > 75% of the time in order to perserve health/function of abdominopelvic area.    Time 12   Period Weeks   Status On-going   PT LONG TERM GOAL #5   Title Pt will demo deep core activation with floor to rise (low lunge) and UE support 3 reps without cuing in order to demo improved balance and functional mobility/strength.   Time 12   Period Weeks   Status New   Additional Long Term Goals   Additional Long Term Goals Yes   PT LONG TERM GOAL #6   Title Pt will be able to demo grapevine 10 ft L and R and report no pain in order to progress with balance exercises and return to dance.   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 Post-Tx, Pt reported a decrease of  3/10 pain to 2/10 and demo'd increased gait speed from 0.7 m/s to 1.0 m/s. Pt responded to manual Tx and taping to stabilize pelvic girdle.  Pt required education about pelvic girdle stability principles and neuro-reedu with sit-to-stand t/f and progression of dynamic stabilization 3-4.    Pt will benefit from skilled therapeutic intervention in order to improve on the following deficits Abnormal gait;Difficulty walking;Decreased range of motion;Decreased coordination;Decreased balance;Decreased activity tolerance;Decreased safety awareness;Decreased endurance;Pain;Impaired flexibility;Improper body  mechanics;Decreased strength;Decreased mobility;Postural dysfunction;Increased muscle spasms   Rehab Potential Good   Clinical Impairments Affecting Rehab Potential age   PT Frequency 1x / week   PT Duration 12 weeks   PT Treatment/Interventions ADLs/Self Care Home Management;Moist Heat;Traction;Aquatic Therapy;Biofeedback;Electrical Stimulation;Cryotherapy;Stair training;Gait training;Therapeutic activities;Therapeutic exercise;Dry needling;Energy conservation;Taping;Manual techniques;Neuromuscular re-education;Patient/family education;Functional mobility training;Passive range of motion;Balance training   PT Next Visit  Plan abdominal massage, WC t/f,band exercises   Consulted and Agree with Plan of Care Patient        Problem List There are no active problems to display for this patient.   Mariane Masters ,PT, DPT, E-RYT  08/23/2014, 12:27 PM  [facility] [facility] MAIN [facility] SERVICES [address] [place], [place], [address] Phone: [phone]   Fax:  [phone]

## 2014-08-24 ENCOUNTER — Encounter: Payer: Medicare Other | Admitting: Physical Therapy

## 2014-08-25 IMAGING — CR DG CHEST 2V
1 series · 2 of 2 positions shown · non-contrast
Comparison: none

REASON FOR EXAM: right posterior mid to upper back pain - pleuritic in
nature.
COMMENTS:

[Series 1: w chest pa · 0.14mm/px · 2 of 2 slices shown]
[im 1/2]
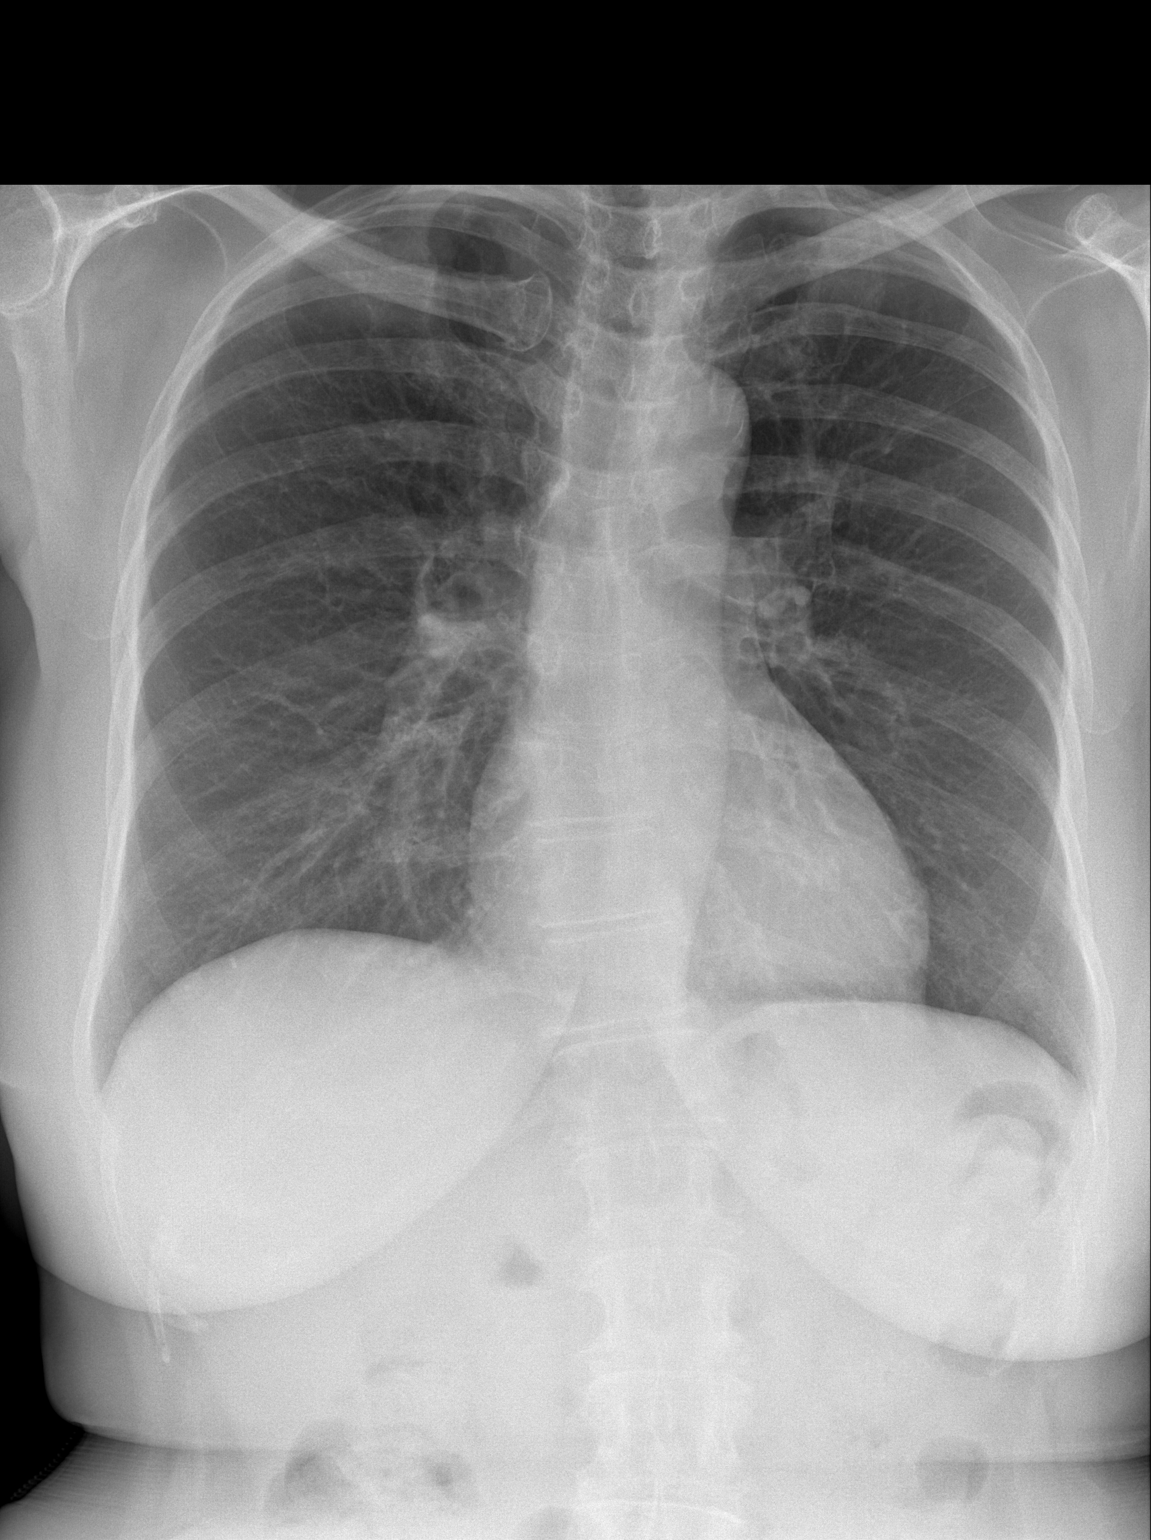
[im 2/2]
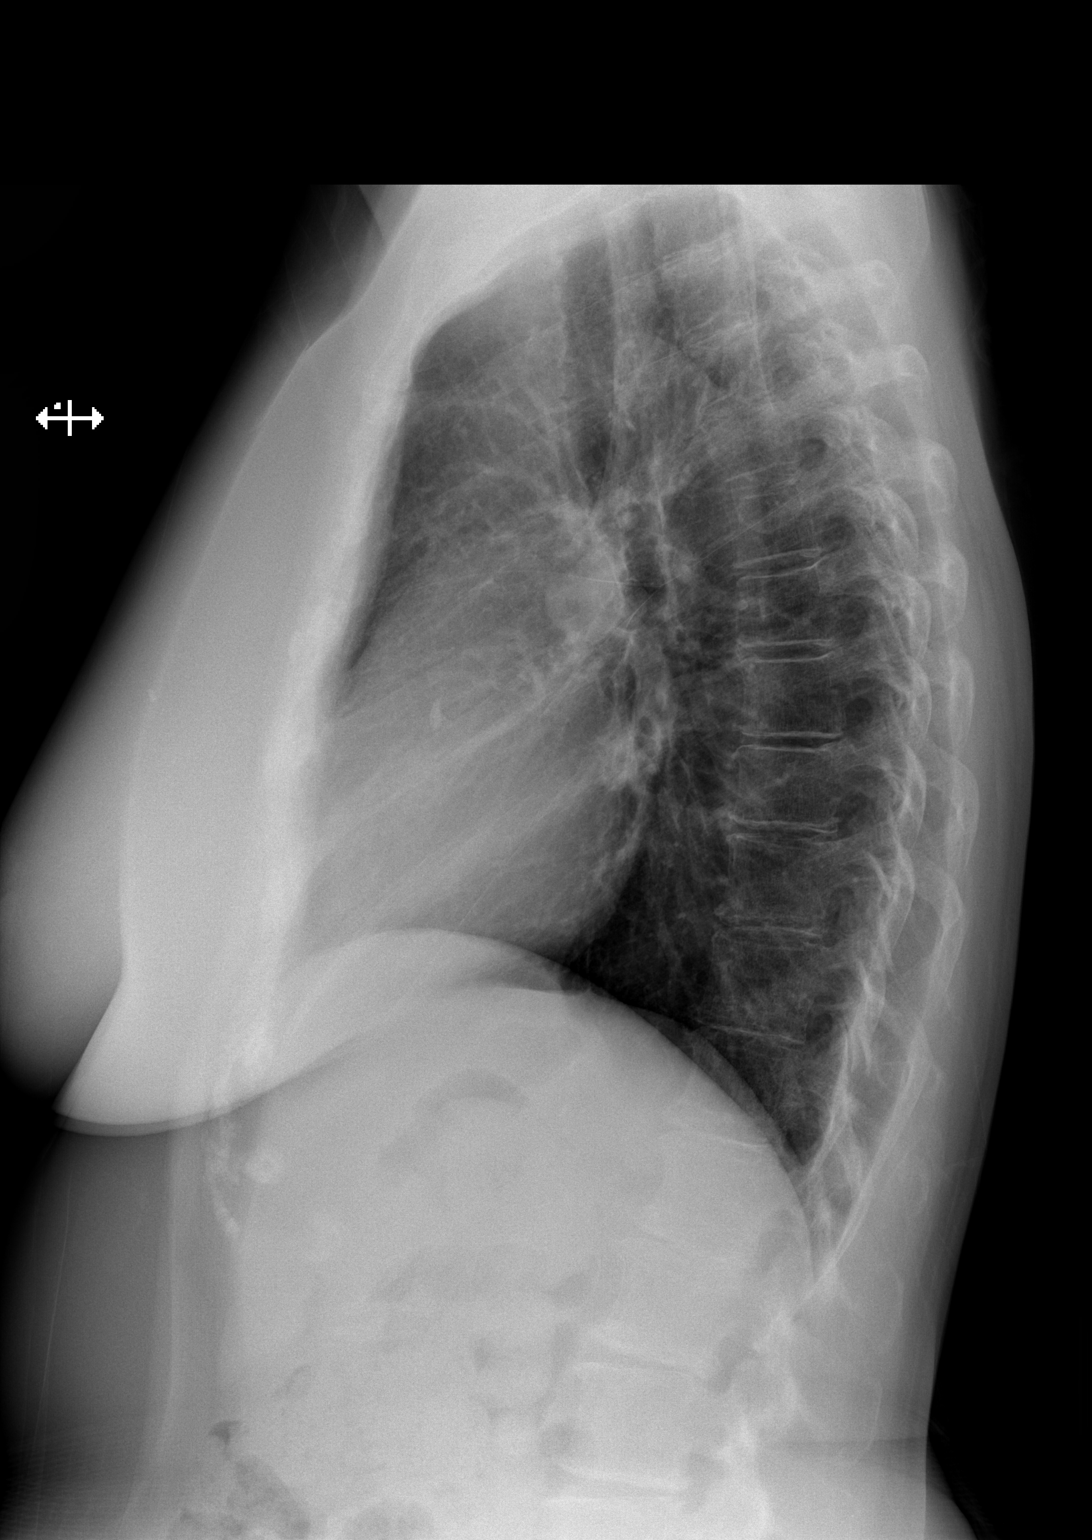

[2 of 2 positions shown; findings below may reference images not displayed]

PROCEDURE:     DXR - DXR CHEST PA (OR AP) AND LATERAL  - June 06, 2012  [DATE]

RESULT:     Comparison is made to study August 21, 2011.

The lungs are well-expanded and clear. The cardiac silhouette is normal in
size. The mediastinum is normal in width. There is no pleural effusion or
pneumothorax. There is gentle curvature of the thoracolumbar spine in an
S-shaped configuration.
IMPRESSION: There is no evidence of acute cardiopulmonary abnormality.

[REDACTED]

## 2014-08-30 ENCOUNTER — Ambulatory Visit: Payer: Medicare Other | Admitting: Physical Therapy

## 2014-08-31 ENCOUNTER — Encounter: Payer: Medicare Other | Admitting: Physical Therapy

## 2014-09-07 ENCOUNTER — Encounter: Payer: Medicare Other | Admitting: Physical Therapy

## 2014-09-07 ENCOUNTER — Ambulatory Visit: Payer: Medicare Other | Attending: Obstetrics and Gynecology | Admitting: Physical Therapy

## 2014-09-07 DIAGNOSIS — R293 Abnormal posture: Secondary | ICD-10-CM | POA: Diagnosis present

## 2014-09-07 DIAGNOSIS — M629 Disorder of muscle, unspecified: Secondary | ICD-10-CM | POA: Diagnosis not present

## 2014-09-07 DIAGNOSIS — R279 Unspecified lack of coordination: Secondary | ICD-10-CM | POA: Diagnosis present

## 2014-09-07 NOTE — Patient Instructions (Addendum)
PELVIC FLOOR / KEGEL EXERCISES   Pelvic floor/ Kegel exercises are used to strengthen the muscles in the base of your pelvis that are responsible for supporting your pelvic organs and preventing urine/feces leakage. Based on your therapist's recommendations, they can be performed while standing, sitting, or lying down. Imagine pelvic floor area as a diamond with pelvic landmarks: top =pubic bone, bottom tip=tailbone, sides=sitting bones (ischial tuberosities).    Make yourself aware of this muscle group by using these cues while coordinating your breath:  Inhale, feel pelvic floor diamond area lower like hammock towards your feet and ribcage/belly expanding. Pause. Let the exhale naturally and feel your belly sink, abdominal muscles hugging in around you and you may notice the pelvic diamond draws upward towards your head forming a umbrella shape. Give a squeeze during the exhalation like you are stopping the flow of urine. If you are squeezing the buttock muscles, try to give 50% less effort.   Common Errors:  Breath holding: If you are holding your breath, you may be bearing down against your bladder instead of pulling it up. If you belly bulges up while you are squeezing, you are holding your breath. Be sure to breathe gently in and out while exercising. Counting out loud may help you avoid holding your breath.  Accessory muscle use: You should not see or feel other muscle movement when performing pelvic floor exercises. When done properly, no one can tell that you are performing the exercises. Keep the buttocks, belly and inner thighs relaxed.  Overdoing it: Your muscles can fatigue and stop working for you if you over-exercise. You may actually leak more or feel soreness at the lower abdomen or rectum.  YOUR HOME EXERCISE PROGRAM     SHORT HOLDS: Position: on back with pillow under hips   Inhale and then exhale. Then squeeze the muscle.  (Be sure to let belly sink in with exhales and  not push outward)  Perform 5 repetitions,  5  Times/day                      DECREASE DOWNWARD PRESSURE ON  YOUR PELVIC FLOOR, ABDOMINAL, LOW BACK MUSCLES       PRESERVE YOUR PELVIC HEALTH LONG-TERM   ** SQUEEZE pelvic floor BEFORE YOUR SNEEZE, COUGH, LAUGH   ** EXHALE BEFORE YOU RISE AGAINST GRAVITY (lifting, sit to stand, from squat to stand)   ** LOG ROLL OUT OF BED INSTEAD OF CRUNCH/SIT-UP      You are now ready to begin training the deep core muscles system: diaphragm, transverse abdominis, pelvic floor . These muscles must work together as a team.           The key to these exercises to train the brain to coordinate the timing of these muscles and to have them turn on for long periods of time to hold you upright against gravity (especially important if you are on your feet all day).These muscles are postural muscles and play a role stabilizing your spine and bodyweight. By doing these repetitions slowly and correctly instead of doing crunches, you will achieve a flatter belly without a lower pooch. You are also placing your spine in a more neutral position and breathing properly which in turn, decreases your risk for problems related to your pelvic floor, abdominal, and low back such as pelvic organ prolapse, hernias, diastasis recti (separation of superficial muscles), disk herniations, spinal fractures. These exercises set a solid foundation for you to later progress  to resistance/ strength training with therabands and weights and return to other typical fitness exercises with a stronger deeper core.    Perform only Level 2-3.  20x/ 2 x day

## 2014-09-08 NOTE — Therapy (Signed)
[place] [facility] MAIN [facility] SERVICES [address] [place], [place], [address] Phone: [phone]   Fax:  [phone]  Physical Therapy Treatment  Patient Details  Name: Kathy Morgan MRN: [id number] Date of Birth: January 22, 1934 Referring Provider:  Christeen Douglas, MD  Encounter Date: 09/07/2014      PT End of Session - 09/07/14 1055    Visit Number 7   Number of Visits 12   Date for PT Re-Evaluation 09/30/14   Authorization Type G code at 10th visit   PT Start Time 0907   PT Stop Time 1010   PT Time Calculation (min) 63 min   Activity Tolerance Other (comment);Patient tolerated treatment well;No increased pain  no hands on assessment/Tx performed due to pt needing to leave early   Behavior During Therapy [facility] for tasks assessed/performed      Past Medical History  Diagnosis Date  . Hypertension   . Hyperlipidemia     Past Surgical History  Procedure Laterality Date  . Vaginal hysterectomy  1991  . Breast surgery Right 2011    R cyst removal (benign)     There were no vitals filed for this visit.  Visit Diagnosis:  Fascial defect  Lack of coordination  Poor posture      Subjective Assessment - 09/07/14 0912    Subjective Pt reported she has not been doing her exercises 2x a day like she usually has over the past weeks due to caring for her husband. Pt has  been doing them 1x/day as noticed her urinary frequnecy has returned compared to it being less frequent when she was performing her exercises 2x/day. Last week, pt experienced "heaviness " feeling returning for two nights that persisted into the morning but dissipated during the day. Pt 's hip pain has resolved as well.  Pt has been performing dynamic stabilization level 1-2 but has not performing any of the band exercises. Pt  has been avoiding log rolling due to slight hip pain and has resorted back to half crunch method.      Pertinent History Hx of vaginal delivery, unknown  whether she had epsiotomy. Hemorrhoids after delivery of son. Daily bowel movements 2x. day,  Bristol Scale Type 3 with straining.  fluid: (3) 8 fl oz/ day, (2) cups of decaffeinated tea/ week. Hobbies: latin dancing   Patient Stated Goals stop the prolapse from getting worse and not to feel the "annoyance"     Pain Onset Today  ~80 yo on and off             [facility] PT Assessment - 09/08/14 0826    Bed Mobility   Bed Mobility --  no hip pain w/ proper log rolling cues    Rolling Left --  pt skipped the step with shoulder flexion. (hip pain +)                   Pelvic Floor Special Questions - 09/08/14 6962    Exam Type Vaginal  verbally consented w/o contraindications   Palpation --  circumferential contraction w/ pillow under  hips required   Strength # of reps 5   Strength # of seconds 1   Biofeedback required cuing to coordinate contractino w/ exhalation rather w/ inhalation            [facility] Adult PT Treatment/Exercise - 09/08/14 0826    Self-Care   Other Self-Care Comments  education on compliance to techniques to decrease downward pressure of pelvic floor  to minimize relapse of Sx and continuation of pelvic floor exercises/dynamic stabilization   Therapeutic Activites    ADL's sit to stand w/ pelvic floor contractions   Neuro Re-ed    Neuro Re-ed Details  reviewed dyn level 1-3                 PT Education - 09/07/14 1051    Education provided Yes   Education Details HEP, emphasis on avoiding bearing down movements such as half crunch out of bed, cuing for log rolling and proper pelvic floor coordination   Person(s) Educated Patient   Methods Explanation;Tactile cues;Handout;Verbal cues;Demonstration   Comprehension Verbalized understanding             PT Long Term Goals - 09/07/14 1056    PT LONG TERM GOAL #1   Title Pt will report across a month her suprapubic pressure sensation will decrease from 4 x/month to < 2x month to increase  QOL.    Time 12   Period Weeks   Status Achieved   PT LONG TERM GOAL #2   Title Pt report decreased back pain from 25% to < 20% after bending over and picking up grocery bag of 8#  from the floor 5 reps in order to increase particiaption w. ADLs.    Time 12   Period Weeks   Status On-going   PT LONG TERM GOAL #3   Title Pt will report decreased nocturia from 3x/night to < 2 x/ night in order to improve her sleep quality.    Time 12   Period Weeks   Status On-going   PT LONG TERM GOAL #4   Title Pt will report having bowel consistency of Bristol Scale Stool Type 3-4 with decreased straining > 75% of the time in order to perserve health/function of abdominopelvic area.    Time 12   Period Weeks   Status On-going   PT LONG TERM GOAL #5   Title Pt will demo deep core activation with floor to rise (low lunge) and UE support 3 reps without cuing in order to demo improved balance and functional mobility/strength.   Time 12   Period Weeks   Status On-going   PT LONG TERM GOAL #6   Title Pt will be able to demo grapevine 10 ft L and R and report no pain in order to progress with balance exercises and return to dance.   Time 12   Period Weeks   Status Deferred               Plan - 09/08/14 3086    Clinical Impression Statement Pt faces increased time with caring for terminally ill husband and expresses she understands the importance of returning to her HEP 2x/day HEP instead of 1x day after feeling a relapse of Sx for 2 days during the past week. Through motivational interviewing and observing pt's bed mobility techniques, pt demo'd and explained she had been performing the half crunch technique  when getting out of bed instead of using the log rolling technique for the past week. Pt was educated on proper log rolling technique which did not elicit any hip pain. Pt was provided a picture of log rolling and was advised to tape it to her night stand as a reminder. P also required  neuro-edu to coordinate exhalation w/ pelvic floor contraction rather than with inhalation. Pt required pillow under her hips to elicit more circumferential contractions which was a degression from last session two weeks  ago. Pt's HEP was simplified to promote compliance/confidence and pt was re-educated about the techniques to decrease downward pressure on the pelvic floor mm. Anticipate pt will continue to achieve her goals. Pt prefers to keep her appts every other week due to  the  demands of being a caretaker for her husband.    Pt will benefit from skilled therapeutic intervention in order to improve on the following deficits Abnormal gait;Difficulty walking;Decreased range of motion;Decreased coordination;Decreased balance;Decreased activity tolerance;Decreased safety awareness;Decreased endurance;Pain;Impaired flexibility;Improper body mechanics;Decreased strength;Decreased mobility;Postural dysfunction;Increased muscle spasms   Rehab Potential Good   Clinical Impairments Affecting Rehab Potential age   PT Frequency 1x / week   PT Duration 12 weeks   PT Treatment/Interventions ADLs/Self Care Home Management;Moist Heat;Traction;Aquatic Therapy;Biofeedback;Electrical Stimulation;Cryotherapy;Stair training;Gait training;Therapeutic activities;Therapeutic exercise;Dry needling;Energy conservation;Taping;Manual techniques;Neuromuscular re-education;Patient/family education;Functional mobility training;Passive range of motion;Balance training   PT Next Visit Plan abdominal massage, review goals, g-code   Consulted and Agree with Plan of Care Patient        Problem List There are no active problems to display for this patient.   Mariane Masters  ,PT, DPT, E-RYT  09/08/2014, 8:42 AM  [place] MAIN [facility] SERVICES [address] [place], [place], [address] Phone: [phone]   Fax:  [phone]

## 2014-09-21 ENCOUNTER — Ambulatory Visit: Payer: Medicare Other | Admitting: Physical Therapy

## 2014-09-21 DIAGNOSIS — M629 Disorder of muscle, unspecified: Secondary | ICD-10-CM

## 2014-09-21 DIAGNOSIS — R279 Unspecified lack of coordination: Secondary | ICD-10-CM

## 2014-09-21 DIAGNOSIS — R293 Abnormal posture: Secondary | ICD-10-CM

## 2014-09-21 NOTE — Patient Instructions (Addendum)
Feet propped up on stool for bowel movements to minimize straining   Squats by counter with proper alignment  10x/ 3 x day Mini high lunges with proper alignment  5 reps each side/ 2x day with one hand on chair

## 2014-09-22 NOTE — Therapy (Signed)
[place] [facility] MAIN [facility] SERVICES [address] [place], [place], [address] Phone: [phone]   Fax:  [phone]  Physical Therapy Treatment  Patient Details  Name: Kathy Morgan MRN: [id number] Date of Birth: July 04, 1934 Referring Provider:  Christeen Douglas, MD  Encounter Date: 09/21/2014      PT End of Session - 09/22/14 0756    Visit Number 8   Number of Visits 12   Date for PT Re-Evaluation 09/30/14   Authorization Type G code at 10th visit   PT Start Time 0910   PT Stop Time 1010   PT Time Calculation (min) 60 min   Activity Tolerance Patient tolerated treatment well;No increased pain   Behavior During Therapy [facility] for tasks assessed/performed      Past Medical History  Diagnosis Date  . Hypertension   . Hyperlipidemia     Past Surgical History  Procedure Laterality Date  . Vaginal hysterectomy  1991  . Breast surgery Right 2011    R cyst removal (benign)     There were no vitals filed for this visit.  Visit Diagnosis:  Fascial defect  Lack of coordination  Poor posture      Subjective Assessment - 09/22/14 0745    Subjective Pt reported she still keeps up with her HEP (pelvic floor exercises) while she cares for her husband who is not doing well with his chronic disease. Pt expressed his condition has worsened and she had to call the ambulanace last night. Pt stated she was not going to come today and may not be able to stay the whole time.  Pt reported her nocturia has decreased from 3x to 1x/ night. Pt also reported her low back. hip back has  resolved.  Pt has been trying to practice log rolling more regularly which has prevented her pelvic pain from reoccurring.     Pertinent History Hx of vaginal delivery, unknown whether she had epsiotomy. Hemorrhoids after delivery of son. Daily bowel movements 2x. day,  Bristol Scale Type 3 with straining.  fluid: (3) 8 fl oz/ day, (2) cups of decaffeinated tea/ week. Hobbies:  latin dancing   Patient Stated Goals stop the prolapse from getting worse and not to feel the "annoyance"     Currently in Pain? No/denies   Pain Onset --  ~80 yo on and off             [facility] PT Assessment - 09/22/14 0001    Squat   Comments poor knee alignment   Floor to Stand   Comments performed 1 rep of low lunge (RLE limited by previous knee surgery), UE support. Required chunking activity to mini high lunges. Pt reported noticing how out of shape she was and had not t/f to and from floor recently.   able to coordinate w/ exhalation on rise                     [facility] Adult PT Treatment/Exercise - 09/22/14 0748    Self-Care   Other Self-Care Comments  applying deep core in upright functional activities while caring for husband. withhold PT for a month so she can care for her husband during this time. Asked pt if she needed any resources of providers/services during this difficult time w/ her husband's debiliating disease and pt declined.     Therapeutic Activites    ADL's mini squats, and floor to rise chuncking ( mini high lunge), use of stool to elevate feet w/  bowel movements to limit bearing down    10 reps each side x 2 sets   Neuro Re-ed    Neuro Re-ed Details  proper alignment w/ functional activities: squats/ mini-high lunges                 PT Education - 09/22/14 0755    Education provided Yes   Education Details HEP    Person(s) Educated Patient   Methods Explanation;Demonstration;Tactile cues;Verbal cues;Handout   Comprehension Verbalized understanding;Returned demonstration             PT Long Term Goals - 09/21/14 0919    PT LONG TERM GOAL #1   Title Pt will report across a month her suprapubic pressure sensation will decrease from 4 x/month to < 2x month to increase QOL.    Time 12   Period Weeks   Status Achieved   PT LONG TERM GOAL #2   Title Pt report decreased back pain from 25% to < 20% after bending over and picking up  grocery bag of 8#  from the floor 5 reps in order to increase particiaption w. ADLs.    Time 12   Period Weeks   Status On-going   PT LONG TERM GOAL #3   Title Pt will report decreased nocturia from 3x/night to < 2 x/ night in order to improve her sleep quality. (09/21/14: 1x/night for 3 weeks)    Time 12   Period Weeks   Status Achieved   PT LONG TERM GOAL #4   Title Pt will report having bowel consistency of Bristol Scale Stool Type 3-4 with decreased straining > 75% of the time in order to perserve health/function of abdominopelvic area.    Time 12   Period Weeks   Status Achieved   PT LONG TERM GOAL #5   Title Pt will demo deep core activation with floor to rise (low lunge) and UE support 3 reps without cuing in order to demo improved balance and functional mobility/strength.   Time 12   Period Weeks   Status On-going   PT LONG TERM GOAL #6   Title Pt will be able to demo grapevine 10 ft L and R and report no pain in order to progress with balance exercises and return to dance.   Time 12   Period Weeks   Status Deferred               Plan - 09/22/14 0758    Clinical Impression Statement Pt has achieved 3/5 goals. Pt has reported her pelvic and lowback/hip problems have resolved. Pt had one slight relapse of Sx 2 weeks ago but pt recognized the importance of staying compliant to HEP and body mechanics to minimize downward pressure on pelvic floor mm in functional activities in order to minimize relapse of Sx. Pt's nocturia has decreased from 3x to 1x/ night. Pt demo signifcantly improved posture with postural stability and decreased slumped postiion in sitting, standing, and gait. Pt also demo'd increased fascial tensitgrity as her pelvic floor contraction s showed stronger and more circumferential contraction without a need of a pillow and bladder showed more cephaled positioning in hooklying position. Pt is currently caring for terminally ill husband and would benefit from  withholding next PT visit until the next month.  Pt is progressing well towards her remaining goals.       Pt will benefit from skilled therapeutic intervention in order to improve on the following deficits Abnormal gait;Difficulty walking;Decreased range of motion;Decreased  coordination;Decreased balance;Decreased activity tolerance;Decreased safety awareness;Decreased endurance;Pain;Impaired flexibility;Improper body mechanics;Decreased strength;Decreased mobility;Postural dysfunction;Increased muscle spasms   Rehab Potential Good   Clinical Impairments Affecting Rehab Potential age   PT Frequency 1x / week   PT Duration 12 weeks   PT Treatment/Interventions ADLs/Self Care Home Management;Moist Heat;Traction;Aquatic Therapy;Biofeedback;Electrical Stimulation;Cryotherapy;Stair training;Gait training;Therapeutic activities;Therapeutic exercise;Dry needling;Energy conservation;Taping;Manual techniques;Neuromuscular re-education;Patient/family education;Functional mobility training;Passive range of motion;Balance training   PT Next Visit Plan abdominal massage, review goals, g-code   Consulted and Agree with Plan of Care Patient          G-Codes - 2014/09/25 0804    Functional Assessment Tool Used clinical impression   Functional Limitation Self care;Mobility: Walking and moving around   Mobility: Walking and Moving Around Current Status [phone]) At least 20 percent but less than 40 percent impaired, limited or restricted   Mobility: Walking and Moving Around Goal Status [phone]) At least 1 percent but less than 20 percent impaired, limited or restricted   Self Care Current Status (U9811) At least 20 percent but less than 40 percent impaired, limited or restricted   Self Care Goal Status (B1478) At least 1 percent but less than 20 percent impaired, limited or restricted      Problem List There are no active problems to display for this patient.   Mariane Masters  ,PT, DPT, E-RYT  09-25-2014,  8:09 AM  [place] [facility] MAIN [facility] SERVICES [address] [place], [place], [address] Phone: [phone]   Fax:  [phone]

## 2014-10-05 ENCOUNTER — Ambulatory Visit: Payer: Medicare Other | Admitting: Physical Therapy

## 2014-10-21 ENCOUNTER — Ambulatory Visit: Payer: Medicare Other | Admitting: Physical Therapy

## 2014-10-26 ENCOUNTER — Ambulatory Visit: Payer: Medicare Other | Admitting: Physical Therapy

## 2014-12-07 ENCOUNTER — Encounter: Payer: Self-pay | Admitting: Physical Therapy

## 2014-12-07 NOTE — Therapy (Signed)
[place] MAIN [facility] SERVICES [address] [place], [place], [address] Phone: [phone]   Fax:  [phone]  December 07, 2014     Physical Therapy Discharge Summary  Patient: Angela Vazguez  MRN: [id number]  Date of Birth: 13-Apr-1934   Diagnosis: Pelvic ORgan Prolapse   The above patient had been seen in Physical Therapy across 8 visits.  The treatment consisted of manual Tx, pelvic floor and hip strengthening, there ex, neuromuscular re-education training for sitting/standing posture, bed mobility, toileting posture, and techniques that decreases downward pressure on pelvic floor.  The patient has improved with her pressure sensations in the perineal area, hip/back pain.   Subjective: Pt called on 10/23/14 and stated she has not been able to come back to PT due to caring for her terminally ill husband. She feel she has improved a "Very [place] Deal Better" (basedon GROC) and continues to perform her HEP despite a busy schedule. Pt has no had any pressure sensation nor back pain. Pt is self-discharging.   Discharge Findings: Pt has met 3/5 goals with the remaining two unable to assess due to pt self-discharging.   Functional Status at Discharge: Pt has returned to functional ADLs.       PT Long Term Goals - 12/07/14 0815    PT LONG TERM GOAL #1   Title Pt will report across a month her suprapubic pressure sensation will decrease from 4 x/month to < 2x month to increase QOL.    Time 12   Period Weeks   Status Achieved   PT LONG TERM GOAL #2   Title Pt report decreased back pain from 25% to < 20% after bending over and picking up grocery bag of 8#  from the floor 5 reps in order to increase particiaption w. ADLs.    Time 12   Period Weeks   Status On-going   PT LONG TERM GOAL #3   Title Pt will report decreased nocturia from 3x/night to < 2 x/ night in order to improve her sleep quality. (09/21/14: 1x/night for 3 weeks)    Time 12   Period Weeks   Status Achieved   PT LONG TERM GOAL #4   Title Pt will report having bowel consistency of Bristol Scale Stool Type 3-4 with decreased straining > 75% of the time in order to perserve health/function of abdominopelvic area.    Time 12   Period Weeks   Status Achieved   PT LONG TERM GOAL #5   Title Pt will demo deep core activation with floor to rise (low lunge) and UE support 3 reps without cuing in order to demo improved balance and functional mobility/strength.   Time 12   Period Weeks   Status On-going   PT LONG TERM GOAL #6   Title Pt will be able to demo grapevine 10 ft L and R and report no pain in order to progress with balance exercises and return to dance.   Time 12   Period Weeks   Status Deferred         Sincerely,   Jerl Mina, PT ,PT, DPT, E-RYT      [place] [address] [place], [place], [address] Phone: [phone]   Fax:  [phone]  Patient: Keziyah Kneale  MRN: [id number]  Date of Birth: August 08, 1934

## 2018-11-06 ENCOUNTER — Other Ambulatory Visit
Admission: RE | Admit: 2018-11-06 | Discharge: 2018-11-06 | Disposition: A | Discharge status: Home / Self Care | Payer: Medicare Other | Source: Ambulatory Visit | Attending: Internal Medicine | Admitting: Internal Medicine

## 2018-11-06 DIAGNOSIS — E669 Obesity, unspecified: Secondary | ICD-10-CM | POA: Insufficient documentation

## 2018-11-06 DIAGNOSIS — I119 Hypertensive heart disease without heart failure: Secondary | ICD-10-CM | POA: Insufficient documentation

## 2018-11-06 LAB — CBC
HCT: 42.6 % (ref 36.0–46.0)
Hemoglobin: 13.8 g/dL (ref 12.0–15.0)
MCH: 30.5 pg (ref 26.0–34.0)
MCHC: 32.4 g/dL (ref 30.0–36.0)
MCV: 94 fL (ref 80.0–100.0)
Platelets: 181 10*3/uL (ref 150–400)
RBC: 4.53 MIL/uL (ref 3.87–5.11)
RDW: 13.8 % (ref 11.5–15.5)
WBC: 7 10*3/uL (ref 4.0–10.5)
nRBC: 0 % (ref 0.0–0.2)

## 2018-11-06 LAB — COMPREHENSIVE METABOLIC PANEL
ALT: 19 U/L (ref 0–44)
AST: 26 U/L (ref 15–41)
Albumin: 4.6 g/dL (ref 3.5–5.0)
Alkaline Phosphatase: 87 U/L (ref 38–126)
Anion gap: 10 (ref 5–15)
BUN: 15 mg/dL (ref 8–23)
CO2: 26 mmol/L (ref 22–32)
Calcium: 9.4 mg/dL (ref 8.9–10.3)
Chloride: 106 mmol/L (ref 98–111)
Creatinine, Ser: 0.79 mg/dL (ref 0.44–1.00)
GFR calc Af Amer: >60 mL/min (ref >60)
GFR calc non Af Amer: >60 mL/min (ref >60)
Glucose, Bld: 102 mg/dL — ABNORMAL HIGH (ref 70–99)
Potassium: 3.8 mmol/L (ref 3.5–5.1)
Sodium: 142 mmol/L (ref 135–145)
Total Bilirubin: 1.4 mg/dL — ABNORMAL HIGH (ref 0.3–1.2)
Total Protein: 8.7 g/dL — ABNORMAL HIGH (ref 6.5–8.1)

## 2018-11-06 LAB — LIPID PANEL
Cholesterol: 173 mg/dL (ref 0–200)
HDL: 70 mg/dL (ref >40)
LDL Cholesterol: 94 mg/dL (ref 0–99)
Total CHOL/HDL Ratio: 2.5 RATIO
Triglycerides: 43 mg/dL (ref <150)
VLDL: 9 mg/dL (ref 0–40)

## 2018-11-06 LAB — TSH: TSH: 2.598 u[IU]/mL (ref 0.350–4.500)

## 2018-12-23 ENCOUNTER — Encounter: Payer: Self-pay | Admitting: Ophthalmology

## 2018-12-23 ENCOUNTER — Other Ambulatory Visit: Payer: Self-pay

## 2018-12-30 NOTE — Discharge Instructions (Signed)

## 2019-01-01 ENCOUNTER — Other Ambulatory Visit: Payer: Self-pay

## 2019-01-01 ENCOUNTER — Other Ambulatory Visit
Admission: RE | Admit: 2019-01-01 | Discharge: 2019-01-01 | Disposition: A | Discharge status: Home / Self Care | Payer: Medicare Other | Source: Ambulatory Visit | Attending: Ophthalmology | Admitting: Ophthalmology

## 2019-01-01 DIAGNOSIS — Z20828 Contact with and (suspected) exposure to other viral communicable diseases: Secondary | ICD-10-CM | POA: Diagnosis not present

## 2019-01-01 DIAGNOSIS — Z01812 Encounter for preprocedural laboratory examination: Secondary | ICD-10-CM | POA: Insufficient documentation

## 2019-01-02 LAB — SARS CORONAVIRUS 2 (TAT 6-24 HRS): SARS Coronavirus 2: NEGATIVE

## 2019-01-05 ENCOUNTER — Encounter
Admission: RE | Disposition: A | Discharge status: Home / Self Care | Payer: Self-pay | Source: Home / Self Care | Attending: Ophthalmology

## 2019-01-05 ENCOUNTER — Encounter: Payer: Self-pay | Admitting: Ophthalmology

## 2019-01-05 ENCOUNTER — Ambulatory Visit: Payer: Medicare Other | Admitting: Anesthesiology

## 2019-01-05 ENCOUNTER — Other Ambulatory Visit: Payer: Self-pay

## 2019-01-05 ENCOUNTER — Ambulatory Visit
Admission: RE | Admit: 2019-01-05 | Discharge: 2019-01-05 | Disposition: A | Discharge status: Home / Self Care | Payer: Medicare Other | Attending: Ophthalmology | Admitting: Ophthalmology

## 2019-01-05 DIAGNOSIS — M1990 Unspecified osteoarthritis, unspecified site: Secondary | ICD-10-CM | POA: Diagnosis not present

## 2019-01-05 DIAGNOSIS — Z79899 Other long term (current) drug therapy: Secondary | ICD-10-CM | POA: Diagnosis not present

## 2019-01-05 DIAGNOSIS — H2511 Age-related nuclear cataract, right eye: Secondary | ICD-10-CM | POA: Diagnosis not present

## 2019-01-05 DIAGNOSIS — Z90710 Acquired absence of both cervix and uterus: Secondary | ICD-10-CM | POA: Insufficient documentation

## 2019-01-05 DIAGNOSIS — I10 Essential (primary) hypertension: Secondary | ICD-10-CM | POA: Insufficient documentation

## 2019-01-05 DIAGNOSIS — E7800 Pure hypercholesterolemia, unspecified: Secondary | ICD-10-CM | POA: Diagnosis not present

## 2019-01-05 HISTORY — DX: Unspecified osteoarthritis, unspecified site: M19.90

## 2019-01-05 HISTORY — DX: Dizziness and giddiness: R42

## 2019-01-05 HISTORY — PX: CATARACT EXTRACTION W/PHACO: SHX586

## 2019-01-05 HISTORY — DX: Presence of dental prosthetic device (complete) (partial): Z97.2

## 2019-01-05 SURGERY — PHACOEMULSIFICATION, CATARACT, WITH IOL INSERTION
Anesthesia: Monitor Anesthesia Care | Site: Eye | Laterality: Right

## 2019-01-05 MED ORDER — EPINEPHRINE PF 1 MG/ML IJ SOLN
INTRAOCULAR | Status: DC | PRN
  Administered 2019-01-05: 79 mL via OPHTHALMIC

## 2019-01-05 MED ORDER — SODIUM HYALURONATE 23 MG/ML IO SOLN
INTRAOCULAR | Status: DC | PRN
  Administered 2019-01-05: 0.6 mL via INTRAOCULAR

## 2019-01-05 MED ORDER — LIDOCAINE HCL (PF) 2 % IJ SOLN
INTRAOCULAR | Status: DC | PRN
  Administered 2019-01-05: 1 mL via INTRAOCULAR

## 2019-01-05 MED ORDER — TETRACAINE HCL 0.5 % OP SOLN
1.0000 [drp] | OPHTHALMIC | Status: DC | PRN
  Administered 2019-01-05 (×3): 1 [drp] via OPHTHALMIC

## 2019-01-05 MED ORDER — FENTANYL CITRATE (PF) 100 MCG/2ML IJ SOLN
INTRAMUSCULAR | Status: DC | PRN
  Administered 2019-01-05: 50 ug via INTRAVENOUS

## 2019-01-05 MED ORDER — MIDAZOLAM HCL 2 MG/2ML IJ SOLN
INTRAMUSCULAR | Status: DC | PRN
  Administered 2019-01-05: 1.5 mg via INTRAVENOUS

## 2019-01-05 MED ORDER — MOXIFLOXACIN HCL 0.5 % OP SOLN
OPHTHALMIC | Status: DC | PRN
  Administered 2019-01-05: 0.2 mL via OPHTHALMIC

## 2019-01-05 MED ORDER — ARMC OPHTHALMIC DILATING DROPS
1.0000 "application " | OPHTHALMIC | Status: DC | PRN
  Administered 2019-01-05 (×3): 1 via OPHTHALMIC

## 2019-01-05 MED ORDER — SODIUM HYALURONATE 10 MG/ML IO SOLN
INTRAOCULAR | Status: DC | PRN
  Administered 2019-01-05: 0.55 mL via INTRAOCULAR

## 2019-01-05 SURGICAL SUPPLY — 19 items
CANNULA ANT/CHMB 27G (MISCELLANEOUS) ×2 IMPLANT
CANNULA ANT/CHMB 27GA (MISCELLANEOUS) ×6 IMPLANT
DISSECTOR HYDRO NUCLEUS 50X22 (MISCELLANEOUS) ×3 IMPLANT
GLOVE SURG LX 7.5 STRW (GLOVE) ×2
GLOVE SURG LX STRL 7.5 STRW (GLOVE) ×1 IMPLANT
GLOVE SURG SYN 8.5  E (GLOVE) ×2
GLOVE SURG SYN 8.5 E (GLOVE) ×1 IMPLANT
GLOVE SURG SYN 8.5 PF PI (GLOVE) ×1 IMPLANT
GOWN STRL REUS W/ TWL LRG LVL3 (GOWN DISPOSABLE) ×2 IMPLANT
GOWN STRL REUS W/TWL LRG LVL3 (GOWN DISPOSABLE) ×4
LENS IOL TECNIS ITEC 15.5 (Intraocular Lens) ×2 IMPLANT
MARKER SKIN DUAL TIP RULER LAB (MISCELLANEOUS) ×3 IMPLANT
PACK DR. KING ARMS (PACKS) ×3 IMPLANT
PACK EYE AFTER SURG (MISCELLANEOUS) ×3 IMPLANT
PACK OPTHALMIC (MISCELLANEOUS) ×3 IMPLANT
SYR 3ML LL SCALE MARK (SYRINGE) ×3 IMPLANT
SYR TB 1ML LUER SLIP (SYRINGE) ×3 IMPLANT
WATER STERILE IRR 250ML POUR (IV SOLUTION) ×3 IMPLANT
WIPE NON LINTING 3.25X3.25 (MISCELLANEOUS) ×3 IMPLANT

## 2019-01-05 NOTE — Transfer of Care (Signed)
Immediate Anesthesia Transfer of Care Note  Patient: Kathy Morgan  Procedure(s) Performed: CATARACT EXTRACTION PHACO AND INTRAOCULAR LENS PLACEMENT (IOC) RIGHT 2.88  00:29.7 (Right Eye)  Patient Location: PACU  Anesthesia Type: MAC  Level of Consciousness: awake, alert  and patient cooperative  Airway and Oxygen Therapy: Patient Spontanous Breathing and Patient connected to supplemental oxygen  Post-op Assessment: Post-op Vital signs reviewed, Patient's Cardiovascular Status Stable, Respiratory Function Stable, Patent Airway and No signs of Nausea or vomiting  Post-op Vital Signs: Reviewed and stable  Complications: No apparent anesthesia complications

## 2019-01-05 NOTE — Anesthesia Postprocedure Evaluation (Signed)
Anesthesia Post Note  Patient: Kathy Morgan  Procedure(s) Performed: CATARACT EXTRACTION PHACO AND INTRAOCULAR LENS PLACEMENT (IOC) RIGHT 2.88  00:29.7 (Right Eye)     Patient location during evaluation: PACU Anesthesia Type: MAC Level of consciousness: awake and alert Pain management: pain level controlled Vital Signs Assessment: post-procedure vital signs reviewed and stable Respiratory status: spontaneous breathing Cardiovascular status: blood pressure returned to baseline Postop Assessment: no apparent nausea or vomiting, adequate PO intake and no headache Anesthetic complications: no    Adele Barthel Yecenia Dalgleish

## 2019-01-05 NOTE — H&P (Signed)

## 2019-01-05 NOTE — Anesthesia Preprocedure Evaluation (Signed)
Anesthesia Evaluation  Patient identified by MRN, date of birth, ID band Patient awake    History of Anesthesia Complications Negative for: history of anesthetic complications  Airway Mallampati: I  TM Distance: >3 FB Neck ROM: Full    Dental  (+) Upper Dentures   Pulmonary neg pulmonary ROS,    Pulmonary exam normal        Cardiovascular hypertension, Pt. on medications and Pt. on home beta blockers Normal cardiovascular exam     Neuro/Psych negative neurological ROS     GI/Hepatic negative GI ROS, Neg liver ROS,   Endo/Other  negative endocrine ROS  Renal/GU negative Renal ROS     Musculoskeletal   Abdominal   Peds  Hematology negative hematology ROS (+)   Anesthesia Other Findings   Reproductive/Obstetrics                             Anesthesia Physical Anesthesia Plan  ASA: II  Anesthesia Plan: MAC   Post-op Pain Management:    Induction:   PONV Risk Score and Plan: 2 and Midazolam and Treatment may vary due to age or medical condition  Airway Management Planned: Natural Airway and Nasal Cannula  Additional Equipment: None  Intra-op Plan:   Post-operative Plan:   Informed Consent: I have reviewed the patients History and Physical, chart, labs and discussed the procedure including the risks, benefits and alternatives for the proposed anesthesia with the patient or authorized representative who has indicated his/her understanding and acceptance.       Plan Discussed with: CRNA  Anesthesia Plan Comments:         Anesthesia Quick Evaluation

## 2019-01-05 NOTE — Op Note (Signed)
OPERATIVE NOTE  Tonia Avino [id number] 01/05/2019   PREOPERATIVE DIAGNOSIS:  Nuclear sclerotic cataract right eye.  H25.11   POSTOPERATIVE DIAGNOSIS:    Nuclear sclerotic cataract right eye.     PROCEDURE:  Phacoemusification with posterior chamber intraocular lens placement of the right eye   LENS:   Implant Name Type Inv. Item Serial No. Manufacturer Lot No. LRB No. Used Action  LENS IOL DIOP 15.5 - [id number] Intraocular Lens LENS IOL DIOP 15.5 [id number] AMO  Right 1 Implanted       Procedure(s): CATARACT EXTRACTION PHACO AND INTRAOCULAR LENS PLACEMENT (IOC) RIGHT 2.88  00:29.7 (Right)  PCB00 +15.5   ULTRASOUND TIME: 0 minutes 29 seconds.  CDE 2.88   SURGEON:  Willey Blade, MD, MPH  ANESTHESIOLOGIST: Anesthesiologist: Page, Wille Celeste, MD CRNA: Jimmy Picket, CRNA   ANESTHESIA:  Topical with tetracaine drops augmented with 1% preservative-free intracameral lidocaine.  ESTIMATED BLOOD LOSS: less than 1 mL.   COMPLICATIONS:  None.   DESCRIPTION OF PROCEDURE:  The patient was identified in the holding room and transported to the operating room and placed in the supine position under the operating microscope.  The right eye was identified as the operative eye and it was prepped and draped in the usual sterile ophthalmic fashion.   A 1.0 millimeter clear-corneal paracentesis was made at the 10:30 position. 0.5 ml of preservative-free 1% lidocaine with epinephrine was injected into the anterior chamber.  The anterior chamber was filled with Healon 5 viscoelastic.  A 2.4 millimeter keratome was used to make a near-clear corneal incision at the 8:00 position.  A curvilinear capsulorrhexis was made with a cystotome and capsulorrhexis forceps.  Balanced salt solution was used to hydrodissect and hydrodelineate the nucleus.   Phacoemulsification was then used in stop and chop fashion to remove the lens nucleus and epinucleus.  The remaining cortex was then removed using the  irrigation and aspiration handpiece. Healon was then placed into the capsular bag to distend it for lens placement.  A lens was then injected into the capsular bag.  The remaining viscoelastic was aspirated.   Wounds were hydrated with balanced salt solution.  The anterior chamber was inflated to a physiologic pressure with balanced salt solution.   Intracameral vigamox 0.1 mL undiluted was injected into the eye and a drop placed onto the ocular surface.  No wound leaks were noted.  The patient was taken to the recovery room in stable condition without complications of anesthesia or surgery  Willey Blade 01/05/2019, 10:46 AM

## 2019-01-05 NOTE — Anesthesia Procedure Notes (Signed)
Procedure Name: MAC Performed by: Rama Mcclintock, CRNA Pre-anesthesia Checklist: Patient identified, Emergency Drugs available, Suction available, Timeout performed and Patient being monitored Patient Re-evaluated:Patient Re-evaluated prior to induction Oxygen Delivery Method: Nasal cannula Placement Confirmation: positive ETCO2       

## 2019-01-06 ENCOUNTER — Encounter: Payer: Self-pay | Admitting: *Deleted

## 2019-02-16 ENCOUNTER — Other Ambulatory Visit: Payer: Self-pay

## 2019-02-16 ENCOUNTER — Encounter: Payer: Self-pay | Admitting: Ophthalmology

## 2019-02-18 ENCOUNTER — Other Ambulatory Visit
Admission: RE | Admit: 2019-02-18 | Discharge: 2019-02-18 | Disposition: A | Discharge status: Home / Self Care | Payer: Medicare Other | Source: Ambulatory Visit | Attending: Ophthalmology | Admitting: Ophthalmology

## 2019-02-18 DIAGNOSIS — Z01812 Encounter for preprocedural laboratory examination: Secondary | ICD-10-CM | POA: Insufficient documentation

## 2019-02-18 DIAGNOSIS — Z20822 Contact with and (suspected) exposure to covid-19: Secondary | ICD-10-CM | POA: Diagnosis not present

## 2019-02-19 LAB — SARS CORONAVIRUS 2 (TAT 6-24 HRS): SARS Coronavirus 2: NEGATIVE

## 2019-02-19 NOTE — Discharge Instructions (Signed)

## 2019-02-20 ENCOUNTER — Other Ambulatory Visit: Payer: PRIVATE HEALTH INSURANCE

## 2019-02-20 NOTE — Anesthesia Preprocedure Evaluation (Addendum)
Anesthesia Evaluation  Patient identified by MRN, date of birth, ID band Patient awake    Reviewed: Allergy & Precautions, NPO status , Patient's Chart, lab work & pertinent test results  History of Anesthesia Complications Negative for: history of anesthetic complications  Airway Mallampati: I  TM Distance: >3 FB Neck ROM: Full    Dental  (+) Upper Dentures   Pulmonary           Cardiovascular hypertension, Pt. on medications and Pt. on home beta blockers (-) angina(-) DOE    HLD   Neuro/Psych    GI/Hepatic neg GERD  ,  Endo/Other    Renal/GU      Musculoskeletal  (+) Arthritis ,   Abdominal   Peds  Hematology   Anesthesia Other Findings   Reproductive/Obstetrics                            Anesthesia Physical  Anesthesia Plan  ASA: II  Anesthesia Plan: MAC   Post-op Pain Management:    Induction: Intravenous  PONV Risk Score and Plan: 2 and Midazolam and Treatment may vary due to age or medical condition  Airway Management Planned: Natural Airway and Nasal Cannula  Additional Equipment: None  Intra-op Plan:   Post-operative Plan:   Informed Consent: I have reviewed the patients History and Physical, chart, labs and discussed the procedure including the risks, benefits and alternatives for the proposed anesthesia with the patient or authorized representative who has indicated his/her understanding and acceptance.       Plan Discussed with: CRNA  Anesthesia Plan Comments:         Anesthesia Quick Evaluation

## 2019-02-23 ENCOUNTER — Encounter
Admission: RE | Disposition: A | Discharge status: Home / Self Care | Payer: Self-pay | Source: Home / Self Care | Attending: Ophthalmology

## 2019-02-23 ENCOUNTER — Other Ambulatory Visit: Payer: Self-pay

## 2019-02-23 ENCOUNTER — Ambulatory Visit
Admission: RE | Admit: 2019-02-23 | Discharge: 2019-02-23 | Disposition: A | Discharge status: Home / Self Care | Payer: Medicare Other | Attending: Ophthalmology | Admitting: Ophthalmology

## 2019-02-23 ENCOUNTER — Encounter: Payer: Self-pay | Admitting: Ophthalmology

## 2019-02-23 ENCOUNTER — Ambulatory Visit: Payer: Medicare Other | Admitting: Anesthesiology

## 2019-02-23 DIAGNOSIS — Z9841 Cataract extraction status, right eye: Secondary | ICD-10-CM | POA: Insufficient documentation

## 2019-02-23 DIAGNOSIS — M1990 Unspecified osteoarthritis, unspecified site: Secondary | ICD-10-CM | POA: Diagnosis not present

## 2019-02-23 DIAGNOSIS — Z90710 Acquired absence of both cervix and uterus: Secondary | ICD-10-CM | POA: Insufficient documentation

## 2019-02-23 DIAGNOSIS — E7800 Pure hypercholesterolemia, unspecified: Secondary | ICD-10-CM | POA: Insufficient documentation

## 2019-02-23 DIAGNOSIS — E785 Hyperlipidemia, unspecified: Secondary | ICD-10-CM | POA: Diagnosis not present

## 2019-02-23 DIAGNOSIS — I10 Essential (primary) hypertension: Secondary | ICD-10-CM | POA: Diagnosis not present

## 2019-02-23 DIAGNOSIS — H2512 Age-related nuclear cataract, left eye: Secondary | ICD-10-CM | POA: Diagnosis not present

## 2019-02-23 HISTORY — PX: CATARACT EXTRACTION W/PHACO: SHX586

## 2019-02-23 SURGERY — PHACOEMULSIFICATION, CATARACT, WITH IOL INSERTION
Anesthesia: Monitor Anesthesia Care | Site: Eye | Laterality: Left

## 2019-02-23 MED ORDER — MIDAZOLAM HCL 2 MG/2ML IJ SOLN
INTRAMUSCULAR | Status: DC | PRN
  Administered 2019-02-23: 1 mg via INTRAVENOUS

## 2019-02-23 MED ORDER — SODIUM HYALURONATE 10 MG/ML IO SOLN
INTRAOCULAR | Status: DC | PRN
  Administered 2019-02-23: 0.55 mL via INTRAOCULAR

## 2019-02-23 MED ORDER — LIDOCAINE HCL (PF) 2 % IJ SOLN
INTRAOCULAR | Status: DC | PRN
  Administered 2019-02-23: 1 mL via INTRAOCULAR

## 2019-02-23 MED ORDER — TETRACAINE HCL 0.5 % OP SOLN
1.0000 [drp] | OPHTHALMIC | Status: DC | PRN
  Administered 2019-02-23 (×3): 1 [drp] via OPHTHALMIC

## 2019-02-23 MED ORDER — LACTATED RINGERS IV SOLN: 100.0000 mL/h | INTRAVENOUS | Status: DC

## 2019-02-23 MED ORDER — FENTANYL CITRATE (PF) 100 MCG/2ML IJ SOLN
INTRAMUSCULAR | Status: DC | PRN
  Administered 2019-02-23: 50 ug via INTRAVENOUS

## 2019-02-23 MED ORDER — MOXIFLOXACIN HCL 0.5 % OP SOLN
OPHTHALMIC | Status: DC | PRN
  Administered 2019-02-23: 0.2 mL via OPHTHALMIC

## 2019-02-23 MED ORDER — SODIUM HYALURONATE 23 MG/ML IO SOLN
INTRAOCULAR | Status: DC | PRN
  Administered 2019-02-23: 0.6 mL via INTRAOCULAR

## 2019-02-23 MED ORDER — ARMC OPHTHALMIC DILATING DROPS
1.0000 "application " | OPHTHALMIC | Status: DC | PRN
  Administered 2019-02-23 (×3): 1 via OPHTHALMIC

## 2019-02-23 MED ORDER — EPINEPHRINE PF 1 MG/ML IJ SOLN
INTRAOCULAR | Status: DC | PRN
  Administered 2019-02-23: 75 mL via OPHTHALMIC

## 2019-02-23 SURGICAL SUPPLY — 19 items
CANNULA ANT/CHMB 27G (MISCELLANEOUS) ×2 IMPLANT
CANNULA ANT/CHMB 27GA (MISCELLANEOUS) ×6 IMPLANT
DISSECTOR HYDRO NUCLEUS 50X22 (MISCELLANEOUS) ×3 IMPLANT
GLOVE SURG LX 7.5 STRW (GLOVE) ×2
GLOVE SURG LX STRL 7.5 STRW (GLOVE) ×1 IMPLANT
GLOVE SURG SYN 8.5  E (GLOVE) ×2
GLOVE SURG SYN 8.5 E (GLOVE) ×1 IMPLANT
GLOVE SURG SYN 8.5 PF PI (GLOVE) ×1 IMPLANT
GOWN STRL REUS W/ TWL LRG LVL3 (GOWN DISPOSABLE) ×2 IMPLANT
GOWN STRL REUS W/TWL LRG LVL3 (GOWN DISPOSABLE) ×4
LENS IOL TECNIS ITEC 15.5 (Intraocular Lens) ×2 IMPLANT
MARKER SKIN DUAL TIP RULER LAB (MISCELLANEOUS) ×3 IMPLANT
PACK DR. KING ARMS (PACKS) ×3 IMPLANT
PACK EYE AFTER SURG (MISCELLANEOUS) ×3 IMPLANT
PACK OPTHALMIC (MISCELLANEOUS) ×3 IMPLANT
SYR 3ML LL SCALE MARK (SYRINGE) ×3 IMPLANT
SYR TB 1ML LUER SLIP (SYRINGE) ×3 IMPLANT
WATER STERILE IRR 250ML POUR (IV SOLUTION) ×3 IMPLANT
WIPE NON LINTING 3.25X3.25 (MISCELLANEOUS) ×3 IMPLANT

## 2019-02-23 NOTE — Anesthesia Postprocedure Evaluation (Signed)
Anesthesia Post Note  Patient: Kathy Morgan  Procedure(s) Performed: CATARACT EXTRACTION PHACO AND INTRAOCULAR LENS PLACEMENT (IOC) LEFT 4.68  00:41.3 (Left Eye)     Patient location during evaluation: PACU Anesthesia Type: MAC Level of consciousness: awake and alert Pain management: pain level controlled Vital Signs Assessment: post-procedure vital signs reviewed and stable Respiratory status: spontaneous breathing, nonlabored ventilation, respiratory function stable and patient connected to nasal cannula oxygen Cardiovascular status: stable and blood pressure returned to baseline Postop Assessment: no apparent nausea or vomiting Anesthetic complications: no    Demetruis Depaul A  Mariaguadalupe Fialkowski

## 2019-02-23 NOTE — H&P (Signed)

## 2019-02-23 NOTE — Anesthesia Procedure Notes (Signed)
Procedure Name: MAC Date/Time: 02/23/2019 9:23 AM Performed by: Cameron Ali, CRNA Pre-anesthesia Checklist: Patient identified, Emergency Drugs available, Suction available, Timeout performed and Patient being monitored Patient Re-evaluated:Patient Re-evaluated prior to induction Oxygen Delivery Method: Nasal cannula Placement Confirmation: positive ETCO2

## 2019-02-23 NOTE — Transfer of Care (Signed)
Immediate Anesthesia Transfer of Care Note  Patient: Kathy Morgan  Procedure(s) Performed: CATARACT EXTRACTION PHACO AND INTRAOCULAR LENS PLACEMENT (IOC) LEFT 4.68  00:41.3 (Left Eye)  Patient Location: PACU  Anesthesia Type: MAC  Level of Consciousness: awake, alert  and patient cooperative  Airway and Oxygen Therapy: Patient Spontanous Breathing and Patient connected to supplemental oxygen  Post-op Assessment: Post-op Vital signs reviewed, Patient's Cardiovascular Status Stable, Respiratory Function Stable, Patent Airway and No signs of Nausea or vomiting  Post-op Vital Signs: Reviewed and stable  Complications: No apparent anesthesia complications

## 2019-02-23 NOTE — Op Note (Signed)
OPERATIVE NOTE  Kathy Morgan [id number] 02/23/2019   PREOPERATIVE DIAGNOSIS:  Nuclear sclerotic cataract left eye.  H25.12   POSTOPERATIVE DIAGNOSIS:    Nuclear sclerotic cataract left eye.     PROCEDURE:  Phacoemusification with posterior chamber intraocular lens placement of the left eye   LENS:   Implant Name Type Inv. Item Serial No. Manufacturer Lot No. LRB No. Used Action  LENS IOL DIOP 15.5 - [id number] Intraocular Lens LENS IOL DIOP 15.5 [id number] AMO  Left 1 Implanted      Procedure(s) with comments: CATARACT EXTRACTION PHACO AND INTRAOCULAR LENS PLACEMENT (IOC) LEFT 4.68  00:41.3 (Left) - needs to stay 2nd  PCB00 +15.5   ULTRASOUND TIME: 0 minutes 41 seconds.  CDE 4.68   SURGEON:  Willey Blade, MD, MPH   ANESTHESIA:  Topical with tetracaine drops augmented with 1% preservative-free intracameral lidocaine.  ESTIMATED BLOOD LOSS: <1 mL   COMPLICATIONS:  None.   DESCRIPTION OF PROCEDURE:  The patient was identified in the holding room and transported to the operating room and placed in the supine position under the operating microscope.  The left eye was identified as the operative eye and it was prepped and draped in the usual sterile ophthalmic fashion.   A 1.0 millimeter clear-corneal paracentesis was made at the 5:00 position. 0.5 ml of preservative-free 1% lidocaine with epinephrine was injected into the anterior chamber.  The anterior chamber was filled with Healon 5 viscoelastic.  A 2.4 millimeter keratome was used to make a near-clear corneal incision at the 2:00 position.  A curvilinear capsulorrhexis was made with a cystotome and capsulorrhexis forceps.  Balanced salt solution was used to hydrodissect and hydrodelineate the nucleus.   Phacoemulsification was then used in stop and chop fashion to remove the lens nucleus and epinucleus.  The remaining cortex was then removed using the irrigation and aspiration handpiece. Healon was then placed into the capsular  bag to distend it for lens placement.  A lens was then injected into the capsular bag.  The remaining viscoelastic was aspirated.   Wounds were hydrated with balanced salt solution.  The anterior chamber was inflated to a physiologic pressure with balanced salt solution.  Intracameral vigamox 0.1 mL undiltued was injected into the eye and a drop placed onto the ocular surface.  No wound leaks were noted.  The patient was taken to the recovery room in stable condition without complications of anesthesia or surgery  Willey Blade 02/23/2019, 9:45 AM

## 2019-02-24 ENCOUNTER — Encounter: Payer: Self-pay | Admitting: *Deleted

## 2019-03-01 ENCOUNTER — Ambulatory Visit: Payer: Medicare Other | Attending: Internal Medicine

## 2019-03-01 DIAGNOSIS — Z23 Encounter for immunization: Secondary | ICD-10-CM

## 2019-03-01 NOTE — Progress Notes (Signed)
   Covid-19 Vaccination Clinic  Name:  Kathy Morgan    MRN: [id number] DOB: 1934-09-05  03/01/2019  [name] was observed post Covid-19 immunization for 15 minutes without incidence. She was provided with Vaccine Information Sheet and instruction to access the V-Safe system.   [name] was instructed to call 911 with any severe reactions post vaccine: Marland Kitchen Difficulty breathing  . Swelling of your face and throat  . A fast heartbeat  . A bad rash all over your body  . Dizziness and weakness    Immunizations Administered    Name Date Dose VIS Date Route   Pfizer COVID-19 Vaccine 03/01/2019 10:56 AM 0.3 mL 12/12/2018 Intramuscular   Manufacturer: [employer], [employer]   Lot: [id number]   NDC: [address]-4

## 2019-03-23 ENCOUNTER — Ambulatory Visit: Payer: Medicare Other | Attending: Internal Medicine

## 2019-03-23 DIAGNOSIS — Z23 Encounter for immunization: Secondary | ICD-10-CM

## 2019-03-23 NOTE — Progress Notes (Signed)
   Covid-19 Vaccination Clinic  Name:  Kathy Morgan    MRN: [id number] DOB: 1934-04-26  03/23/2019  [name] was observed post Covid-19 immunization for 15 minutes without incident. She was provided with Vaccine Information Sheet and instruction to access the V-Safe system.   [name] was instructed to call 911 with any severe reactions post vaccine: Marland Kitchen Difficulty breathing  . Swelling of face and throat  . A fast heartbeat  . A bad rash all over body  . Dizziness and weakness   Immunizations Administered    Name Date Dose VIS Date Route   Pfizer COVID-19 Vaccine 03/23/2019  9:57 AM 0.3 mL 12/12/2018 Intramuscular   Manufacturer: [employer], [employer]   Lot: [id number]   NDC: [address]-1

## 2019-08-12 ENCOUNTER — Ambulatory Visit: Payer: Medicare Other | Admitting: Internal Medicine

## 2019-09-09 ENCOUNTER — Encounter: Payer: Self-pay | Admitting: Internal Medicine

## 2019-09-09 ENCOUNTER — Ambulatory Visit (INDEPENDENT_AMBULATORY_CARE_PROVIDER_SITE_OTHER): Payer: Medicare Other | Admitting: Internal Medicine

## 2019-09-09 ENCOUNTER — Other Ambulatory Visit: Payer: Self-pay

## 2019-09-09 VITALS — BP 153/79 | HR 74 | Ht 63.0 in | Wt 137.3 lb

## 2019-09-09 DIAGNOSIS — I10 Essential (primary) hypertension: Secondary | ICD-10-CM | POA: Diagnosis not present

## 2019-09-09 DIAGNOSIS — E782 Mixed hyperlipidemia: Secondary | ICD-10-CM | POA: Diagnosis not present

## 2019-09-09 DIAGNOSIS — R42 Dizziness and giddiness: Secondary | ICD-10-CM | POA: Diagnosis not present

## 2019-09-09 DIAGNOSIS — S43421D Sprain of right rotator cuff capsule, subsequent encounter: Secondary | ICD-10-CM

## 2019-09-09 DIAGNOSIS — S43421A Sprain of right rotator cuff capsule, initial encounter: Secondary | ICD-10-CM | POA: Insufficient documentation

## 2019-09-09 DIAGNOSIS — E785 Hyperlipidemia, unspecified: Secondary | ICD-10-CM | POA: Insufficient documentation

## 2019-09-09 NOTE — Assessment & Plan Note (Signed)
Vertigo is stable at the present time. 

## 2019-09-09 NOTE — Assessment & Plan Note (Signed)
-   Today, the patient's blood pressure is well managed on amlodipine & metoprolol. - The patient will continue the current treatment regimen.  - I encouraged the patient to eat a low-sodium diet to help control blood pressure. - I encouraged the patient to live an active lifestyle and complete activities that increases heart rate to 85% target heart rate at least 5 times per week for one hour.    

## 2019-09-09 NOTE — Progress Notes (Signed)
Established Patient Office Visit  SUBJECTIVE:  Subjective  Patient ID: Kathy Morgan, female    DOB: 1934/01/06  Age: 85 y.o. MRN: [id number]  CC:  Chief Complaint  Patient presents with  . Hypertension    HPI Kathy Morgan is a 85 y.o. female presenting today for a hypertension check.   She has been exercising her shoulder to help with the pinching in her shoulder. She bough exercise bands to help with this process and her range of motion seems to have improved since her last visit.   Her blood pressure today is 153/79. She takes her medication as directed and without any difficulty. She denies any missed doses.   She is fully vaccinated against COVID19.    Past Medical History:  Diagnosis Date  . Arthritis    lower back  . Hyperlipidemia   . Hypertension   . Vertigo   . Wears dentures    full upper    Past Surgical History:  Procedure Laterality Date  . BREAST SURGERY Right 2011   R cyst removal (benign)   . CATARACT EXTRACTION W/PHACO Right 01/05/2019   Procedure: CATARACT EXTRACTION PHACO AND INTRAOCULAR LENS PLACEMENT (IOC) RIGHT 2.88  00:29.7;  Surgeon: Nevada Crane, MD;  Location: [facility];  Service: Ophthalmology;  Laterality: Right;  . CATARACT EXTRACTION W/PHACO Left 02/23/2019   Procedure: CATARACT EXTRACTION PHACO AND INTRAOCULAR LENS PLACEMENT (IOC) LEFT 4.68  00:41.3;  Surgeon: Nevada Crane, MD;  Location: [facility];  Service: Ophthalmology;  Laterality: Left;  needs to stay 2nd  . VAGINAL HYSTERECTOMY  1991    Family History  Problem Relation Age of Onset  . Cancer Mother   . Dementia Father     Social History   Socioeconomic History  . Marital status: Married    Spouse name: Not on file  . Number of children: Not on file  . Years of education: Not on file  . Highest education level: Not on file  Occupational History  . Not on file  Tobacco Use  . Smoking status: Never Smoker  . Smokeless tobacco: Never  Used  Vaping Use  . Vaping Use: Never used  Substance and Sexual Activity  . Alcohol use: Yes    Alcohol/week: 2.0 standard drinks    Types: 2 Glasses of wine per week  . Drug use: No  . Sexual activity: Never  Other Topics Concern  . Not on file  Social History Narrative  . Not on file   Social Determinants of Health   Financial Resource Strain:   . Difficulty of Paying Living Expenses: Not on file  Food Insecurity:   . Worried About Programme researcher, broadcasting/film/video in the Last Year: Not on file  . Ran Out of Food in the Last Year: Not on file  Transportation Needs:   . Lack of Transportation (Medical): Not on file  . Lack of Transportation (Non-Medical): Not on file  Physical Activity:   . Days of Exercise per Week: Not on file  . Minutes of Exercise per Session: Not on file  Stress:   . Feeling of Stress : Not on file  Social Connections:   . Frequency of Communication with Friends and Family: Not on file  . Frequency of Social Gatherings with Friends and Family: Not on file  . Attends Religious Services: Not on file  . Active Member of Clubs or Organizations: Not on file  . Attends Banker Meetings: Not on file  .  Marital Status: Not on file  Intimate Partner Violence:   . Fear of Current or Ex-Partner: Not on file  . Emotionally Abused: Not on file  . Physically Abused: Not on file  . Sexually Abused: Not on file     Current Outpatient Medications:  .  amLODipine (NORVASC) 5 MG tablet, Take 5 mg by mouth daily., Disp: , Rfl:  .  atorvastatin (LIPITOR) 10 MG tablet, Take 10 mg by mouth daily., Disp: , Rfl:  .  Brimonidine Tartrate (LUMIFY) 0.025 % SOLN, Apply to eye as needed., Disp: , Rfl:  .  cholecalciferol (VITAMIN D) 400 UNITS TABS tablet, Take 1,000 Units by mouth., Disp: , Rfl:  .  metoprolol succinate (TOPROL-XL) 100 MG 24 hr tablet, Take 100 mg by mouth daily. Take with or immediately following a meal., Disp: , Rfl:    No Known Allergies  ROS Review  of Systems  Constitutional: Negative.   HENT: Negative.   Eyes: Positive for visual disturbance ("occasional floaters" s/p cataracts surgery 01-02 2021).  Respiratory: Negative.   Cardiovascular: Negative.   Gastrointestinal: Negative.   Endocrine: Negative.   Genitourinary: Positive for frequency.  Musculoskeletal: Positive for arthralgias (right shoulder, improved).  Skin: Negative.   Allergic/Immunologic: Negative.   Neurological: Positive for dizziness (occasionally, rare).  Hematological: Negative.   Psychiatric/Behavioral: Negative.   All other systems reviewed and are negative.    OBJECTIVE:    Physical Exam Vitals reviewed.  Constitutional:      Appearance: Normal appearance.  HENT:     Mouth/Throat:     Mouth: Mucous membranes are moist.  Eyes:     Pupils: Pupils are equal, round, and reactive to light.  Neck:     Vascular: No carotid bruit.  Cardiovascular:     Rate and Rhythm: Normal rate and regular rhythm.     Pulses: Normal pulses.     Heart sounds: Normal heart sounds.  Pulmonary:     Effort: Pulmonary effort is normal.     Breath sounds: Normal breath sounds.  Abdominal:     General: Bowel sounds are normal.     Palpations: Abdomen is soft. There is no hepatomegaly, splenomegaly or mass.     Tenderness: There is no abdominal tenderness.     Hernia: No hernia is present.  Musculoskeletal:        General: No tenderness.     Cervical back: Neck supple.     Right lower leg: No edema.     Left lower leg: No edema.  Skin:    Findings: No rash.  Neurological:     Mental Status: She is alert and oriented to person, place, and time.     Motor: No weakness.  Psychiatric:        Mood and Affect: Mood and affect normal.        Behavior: Behavior normal.     BP (!) 153/79   Pulse 74   Ht 5\' 3"  (1.6 m)   Wt 137 lb 4.8 oz (62.3 kg)   BMI 24.32 kg/m  Wt Readings from Last 3 Encounters:  09/09/19 137 lb 4.8 oz (62.3 kg)  02/23/19 140 lb (63.5 kg)    01/05/19 137 lb (62.1 kg)    Health Maintenance Due  Topic Date Due  . TETANUS/TDAP  Never done  . DEXA SCAN  Never done  . PNA vac Low Risk Adult (2 of 2 - PCV13) 09/10/2015  . INFLUENZA VACCINE  08/02/2019    There are no  preventive care reminders to display for this patient.  CBC Latest Ref Rng & Units 11/06/2018 12/17/2012 08/21/2011  WBC 4.0 - 10.5 K/uL 7.0 11.1(H) 6.4  Hemoglobin 12.0 - 15.0 g/dL 40.113.8 02.713.2 25.313.3  Hematocrit 36 - 46 % 42.6 39.4 38.3  Platelets 150 - 400 K/uL 181 165 178   CMP Latest Ref Rng & Units 11/06/2018 12/17/2012 08/21/2011  Glucose 70 - 99 mg/dL 664(Q102(H) 98 034(V105(H)  BUN 8 - 23 mg/dL 15 42(V22(H) 15  Creatinine 0.44 - 1.00 mg/dL 9.560.79 3.870.84 5.640.85  Sodium 135 - 145 mmol/L 142 136 140  Potassium 3.5 - 5.1 mmol/L 3.8 3.9 3.9  Chloride 98 - 111 mmol/L 106 102 103  CO2 22 - 32 mmol/L 26 27 31   Calcium 8.9 - 10.3 mg/dL 9.4 9.7 9.5  Total Protein 6.5 - 8.1 g/dL 3.3(I8.7(H) - 8.8(H)  Total Bilirubin 0.3 - 1.2 mg/dL 9.5(J1.4(H) - 0.9  Alkaline Phos 38 - 126 U/L 87 - 101  AST 15 - 41 U/L 26 - 36  ALT 0 - 44 U/L 19 - 32    Lab Results  Component Value Date   TSH 2.598 11/06/2018   Lab Results  Component Value Date   ALBUMIN 4.6 11/06/2018   ANIONGAP 10 11/06/2018   Lab Results  Component Value Date   CHOL 173 11/06/2018   HDL 70 11/06/2018   LDLCALC 94 11/06/2018   CHOLHDL 2.5 11/06/2018   Lab Results  Component Value Date   TRIG 43 11/06/2018   No results found for: HGBA1C    ASSESSMENT & PLAN:   Problem List Items Addressed This Visit      Cardiovascular and Mediastinum   Hypertension    - Today, the patient's blood pressure is well managed on amlodipine & metoprolol. - The patient will continue the current treatment regimen.  - I encouraged the patient to eat a low-sodium diet to help control blood pressure. - I encouraged the patient to live an active lifestyle and complete activities that increases heart rate to 85% target heart rate at least 5 times  per week for one hour.         Musculoskeletal and Integument   Sprain of right rotator cuff capsule - Primary    Pt has been doing rotator cuff exercises with a band and she says that it has been helping her a whole lot.         Other   Hyperlipidemia    - The patient's hyperlipidemia is stable on lipitor. - The patient will continue the current treatment regimen.  - I encouraged the patient to eat more vegetables and whole wheat, and to avoid fatty foods like whole milk, hard cheese, egg yolks, margarine, baked sweets, and fried foods.  - I encouraged the patient to live an active lifestyle and complete activities for 40 minutes at least three times per week.  - I instructed the patient to go to the ER if they begin having chest pain.        Vertigo    Vertigo is stable at the present time.          No orders of the defined types were placed in this encounter.   Follow-up: Return in about 3 months (around 12/09/2019) for Follow Up, HTN.    Corky DownsJaved Maria Coin, MD [facility] [address], [place], [place] [id number]   By signing my name below, I, [employer], attest that this documentation has been prepared under the  direction and in the presence of Dr. Corky Downs. Electronically Signed: Corky Downs, MD 09/09/19, 11:06 AM   I personally performed the services described in this documentation, which was SCRIBED in my presence. The recorded information has been reviewed and considered accurate. It has been edited as necessary during review. Corky Downs, MD

## 2019-09-09 NOTE — Assessment & Plan Note (Signed)
-   The patient's hyperlipidemia is stable on lipitor. - The patient will continue the current treatment regimen.  - I encouraged the patient to eat more vegetables and whole wheat, and to avoid fatty foods like whole milk, hard cheese, egg yolks, margarine, baked sweets, and fried foods.  - I encouraged the patient to live an active lifestyle and complete activities for 40 minutes at least three times per week.  - I instructed the patient to go to the ER if they begin having chest pain.   

## 2019-09-09 NOTE — Assessment & Plan Note (Signed)
Pt has been doing rotator cuff exercises with a band and she says that it has been helping her a whole lot.

## 2019-10-29 ENCOUNTER — Other Ambulatory Visit: Payer: Self-pay

## 2019-10-29 ENCOUNTER — Ambulatory Visit: Payer: Medicare Other

## 2019-12-04 ENCOUNTER — Other Ambulatory Visit: Payer: Self-pay

## 2019-12-04 ENCOUNTER — Ambulatory Visit (INDEPENDENT_AMBULATORY_CARE_PROVIDER_SITE_OTHER): Payer: Medicare Other

## 2019-12-04 DIAGNOSIS — E782 Mixed hyperlipidemia: Secondary | ICD-10-CM

## 2019-12-04 DIAGNOSIS — Z131 Encounter for screening for diabetes mellitus: Secondary | ICD-10-CM

## 2019-12-04 DIAGNOSIS — R42 Dizziness and giddiness: Secondary | ICD-10-CM

## 2019-12-04 DIAGNOSIS — I10 Essential (primary) hypertension: Secondary | ICD-10-CM

## 2019-12-04 DIAGNOSIS — Z0000 Encounter for general adult medical examination without abnormal findings: Secondary | ICD-10-CM

## 2019-12-05 LAB — COMPLETE METABOLIC PANEL WITH GFR
AG Ratio: 1.2 (calc) (ref 1.0–2.5)
ALT: 16 U/L (ref 6–29)
AST: 23 U/L (ref 10–35)
Albumin: 4.4 g/dL (ref 3.6–5.1)
Alkaline phosphatase (APISO): 90 U/L (ref 37–153)
BUN: 17 mg/dL (ref 7–25)
CO2: 24 mmol/L (ref 20–32)
Calcium: 9.3 mg/dL (ref 8.6–10.4)
Chloride: 105 mmol/L (ref 98–110)
Creat: 0.76 mg/dL (ref 0.60–0.88)
GFR, Est African American: 84 mL/min/{1.73_m2} (ref >=60)
GFR, Est Non African American: 73 mL/min/{1.73_m2} (ref >=60)
Globulin: 3.6 g/dL (calc) (ref 1.9–3.7)
Glucose, Bld: 95 mg/dL (ref 65–99)
Potassium: 4.5 mmol/L (ref 3.5–5.3)
Sodium: 141 mmol/L (ref 135–146)
Total Bilirubin: 0.8 mg/dL (ref 0.2–1.2)
Total Protein: 8 g/dL (ref 6.1–8.1)

## 2019-12-05 LAB — CBC WITH DIFFERENTIAL/PLATELET
Absolute Monocytes: 466 cells/uL (ref 200–950)
Basophils Absolute: 30 cells/uL (ref 0–200)
Basophils Relative: 0.5 %
Eosinophils Absolute: 207 cells/uL (ref 15–500)
Eosinophils Relative: 3.5 %
HCT: 40 % (ref 35.0–45.0)
Hemoglobin: 13.4 g/dL (ref 11.7–15.5)
Lymphs Abs: 2354 cells/uL (ref 850–3900)
MCH: 31.2 pg (ref 27.0–33.0)
MCHC: 33.5 g/dL (ref 32.0–36.0)
MCV: 93.2 fL (ref 80.0–100.0)
MPV: 12.2 fL (ref 7.5–12.5)
Monocytes Relative: 7.9 %
Neutro Abs: 2844 cells/uL (ref 1500–7800)
Neutrophils Relative %: 48.2 %
Platelets: 177 10*3/uL (ref 140–400)
RBC: 4.29 10*6/uL (ref 3.80–5.10)
RDW: 12.3 % (ref 11.0–15.0)
Total Lymphocyte: 39.9 %
WBC: 5.9 10*3/uL (ref 3.8–10.8)

## 2019-12-05 LAB — TSH: TSH: 3.72 mIU/L (ref 0.40–4.50)

## 2019-12-09 ENCOUNTER — Ambulatory Visit: Payer: Medicare Other | Admitting: Internal Medicine

## 2019-12-14 ENCOUNTER — Encounter: Payer: Self-pay | Admitting: Internal Medicine

## 2019-12-14 ENCOUNTER — Other Ambulatory Visit: Payer: Self-pay

## 2019-12-14 ENCOUNTER — Ambulatory Visit (INDEPENDENT_AMBULATORY_CARE_PROVIDER_SITE_OTHER): Payer: Medicare Other | Admitting: Internal Medicine

## 2019-12-14 VITALS — BP 145/74 | HR 70 | Ht 61.5 in | Wt 138.3 lb

## 2019-12-14 DIAGNOSIS — E7800 Pure hypercholesterolemia, unspecified: Secondary | ICD-10-CM

## 2019-12-14 DIAGNOSIS — I10 Essential (primary) hypertension: Secondary | ICD-10-CM

## 2019-12-14 DIAGNOSIS — S43421A Sprain of right rotator cuff capsule, initial encounter: Secondary | ICD-10-CM

## 2019-12-14 DIAGNOSIS — R42 Dizziness and giddiness: Secondary | ICD-10-CM

## 2019-12-14 NOTE — Assessment & Plan Note (Signed)
-   The patient's hyperlipidemia is stable on lipitor. - The patient will continue the current treatment regimen.  - I encouraged the patient to eat more vegetables and whole wheat, and to avoid fatty foods like whole milk, hard cheese, egg yolks, margarine, baked sweets, and fried foods.  - I encouraged the patient to live an active lifestyle and complete activities for 40 minutes at least three times per week.  - I instructed the patient to go to the ER if they begin having chest pain.   

## 2019-12-14 NOTE — Assessment & Plan Note (Signed)
Patient is a sprain of the right shoulder so I advised her shoulder exercises.

## 2019-12-14 NOTE — Assessment & Plan Note (Signed)
-   Today, the patient's blood pressure is well managed on  metoprolol . - The patient will continue the current treatment regimen.  - I encouraged the patient to eat a low-sodium diet to help control blood pressure. - I encouraged the patient to live an active lifestyle and complete activities that increases heart rate to 85% target heart rate at least 5 times per week for one hour.    

## 2019-12-14 NOTE — Assessment & Plan Note (Signed)
resolved 

## 2019-12-14 NOTE — Progress Notes (Signed)
Established Patient Office Visit  Subjective:  Patient ID: Kathy Morgan, female    DOB: 16-Feb-1934  Age: 85 y.o. MRN: [id number]  CC:  Chief Complaint  Patient presents with  . Annual Exam    HPI  Kathy Morgan presents for lab check  , c/pin rt shoulder, patient was discharged in the right shoulder she complains of pain in the right should  She c/o of rt shoulder pain.  She did not have any problem with the left shoulder.  She is exercising with a rubber band. denies chest pain dizziness shortness of breath.  Patient does not smoke does not drink.  Past Medical History:  Diagnosis Date  . Arthritis    lower back  . Hyperlipidemia   . Hypertension   . Vertigo   . Wears dentures    full upper    Past Surgical History:  Procedure Laterality Date  . BREAST SURGERY Right 2011   R cyst removal (benign)   . CATARACT EXTRACTION W/PHACO Right 01/05/2019   Procedure: CATARACT EXTRACTION PHACO AND INTRAOCULAR LENS PLACEMENT (IOC) RIGHT 2.88  00:29.7;  Surgeon: Nevada Crane, MD;  Location: [facility];  Service: Ophthalmology;  Laterality: Right;  . CATARACT EXTRACTION W/PHACO Left 02/23/2019   Procedure: CATARACT EXTRACTION PHACO AND INTRAOCULAR LENS PLACEMENT (IOC) LEFT 4.68  00:41.3;  Surgeon: Nevada Crane, MD;  Location: [facility];  Service: Ophthalmology;  Laterality: Left;  needs to stay 2nd  . VAGINAL HYSTERECTOMY  1991    Family History  Problem Relation Age of Onset  . Cancer Mother   . Dementia Father     Social History   Socioeconomic History  . Marital status: Married    Spouse name: Not on file  . Number of children: Not on file  . Years of education: Not on file  . Highest education level: Not on file  Occupational History  . Not on file  Tobacco Use  . Smoking status: Never Smoker  . Smokeless tobacco: Never Used  Vaping Use  . Vaping Use: Never used  Substance and Sexual Activity  . Alcohol use: Yes    Alcohol/week: 2.0  standard drinks    Types: 2 Glasses of wine per week  . Drug use: No  . Sexual activity: Never  Other Topics Concern  . Not on file  Social History Narrative  . Not on file   Social Determinants of Health   Financial Resource Strain: Not on file  Food Insecurity: Not on file  Transportation Needs: Not on file  Physical Activity: Not on file  Stress: Not on file  Social Connections: Not on file  Intimate Partner Violence: Not on file     Current Outpatient Medications:  .  amLODipine (NORVASC) 5 MG tablet, Take 5 mg by mouth daily., Disp: , Rfl:  .  atorvastatin (LIPITOR) 10 MG tablet, Take 10 mg by mouth daily., Disp: , Rfl:  .  cholecalciferol (VITAMIN D) 400 UNITS TABS tablet, Take 1,000 Units by mouth., Disp: , Rfl:  .  metoprolol succinate (TOPROL-XL) 100 MG 24 hr tablet, Take 100 mg by mouth daily. Take with or immediately following a meal., Disp: , Rfl:    No Known Allergies  ROS Review of Systems  Constitutional: Negative.   HENT: Negative.   Eyes: Negative.   Respiratory: Negative.   Cardiovascular: Negative.   Gastrointestinal: Negative.   Endocrine: Negative.   Genitourinary: Negative.   Musculoskeletal: Negative.  Complain of pain in the right shoulder.  Skin: Negative.   Allergic/Immunologic: Negative.   Neurological: Negative.   Hematological: Negative.   Psychiatric/Behavioral: Negative.   All other systems reviewed and are negative.     Objective:    Physical Exam Musculoskeletal:     Right upper arm: Tenderness present.     Left upper arm: Normal. No swelling, edema or tenderness.       Arms:     Comments: Painful movement of the right shoulder.     BP (!) 145/74   Pulse 70   Ht 5' 1.5" (1.562 m)   Wt 138 lb 4.8 oz (62.7 kg)   BMI 25.71 kg/m  Wt Readings from Last 3 Encounters:  12/14/19 138 lb 4.8 oz (62.7 kg)  09/09/19 137 lb 4.8 oz (62.3 kg)  02/23/19 140 lb (63.5 kg)     Health Maintenance Due  Topic Date Due  .  TETANUS/TDAP  Never done  . DEXA SCAN  Never done  . PNA vac Low Risk Adult (2 of 2 - PCV13) 09/10/2015  . INFLUENZA VACCINE  08/02/2019  . COVID-19 Vaccine (3 - Booster for Pfizer series) 09/23/2019    There are no preventive care reminders to display for this patient.  Lab Results  Component Value Date   TSH 3.72 12/04/2019   Lab Results  Component Value Date   WBC 5.9 12/04/2019   HGB 13.4 12/04/2019   HCT 40.0 12/04/2019   MCV 93.2 12/04/2019   PLT 177 12/04/2019   Lab Results  Component Value Date   NA 141 12/04/2019   K 4.5 12/04/2019   CO2 24 12/04/2019   GLUCOSE 95 12/04/2019   BUN 17 12/04/2019   CREATININE 0.76 12/04/2019   BILITOT 0.8 12/04/2019   ALKPHOS 87 11/06/2018   AST 23 12/04/2019   ALT 16 12/04/2019   PROT 8.0 12/04/2019   ALBUMIN 4.6 11/06/2018   CALCIUM 9.3 12/04/2019   ANIONGAP 10 11/06/2018   Lab Results  Component Value Date   CHOL 173 11/06/2018   Lab Results  Component Value Date   HDL 70 11/06/2018   Lab Results  Component Value Date   LDLCALC 94 11/06/2018   Lab Results  Component Value Date   TRIG 43 11/06/2018   Lab Results  Component Value Date   CHOLHDL 2.5 11/06/2018   No results found for: HGBA1C    Assessment & Plan:   Problem List Items Addressed This Visit      Cardiovascular and Mediastinum   Hypertension    - Today, the patient's blood pressure is well managed on metoprolol. - The patient will continue the current treatment regimen.  - I encouraged the patient to eat a low-sodium diet to help control blood pressure. - I encouraged the patient to live an active lifestyle and complete activities that increases heart rate to 85% target heart rate at least 5 times per week for one hour.            Musculoskeletal and Integument   Sprain of right rotator cuff capsule - Primary    Patient is a sprain of the right shoulder so I advised her shoulder exercises.        Other   Hyperlipidemia    - The  patient's hyperlipidemia is stable on lipitor. - The patient will continue the current treatment regimen.  - I encouraged the patient to eat more vegetables and whole wheat, and to avoid fatty foods like whole milk,  hard cheese, egg yolks, margarine, baked sweets, and fried foods.  - I encouraged the patient to live an active lifestyle and complete activities for 40 minutes at least three times per week.  - I instructed the patient to go to the ER if they begin having chest pain.        Vertigo    resolved         No orders of the defined types were placed in this encounter.   Follow-up: No follow-ups on file.    Corky Downs, MD

## 2019-12-18 ENCOUNTER — Ambulatory Visit (INDEPENDENT_AMBULATORY_CARE_PROVIDER_SITE_OTHER): Payer: Medicare Other

## 2019-12-18 DIAGNOSIS — Z23 Encounter for immunization: Secondary | ICD-10-CM

## 2020-02-26 ENCOUNTER — Ambulatory Visit (INDEPENDENT_AMBULATORY_CARE_PROVIDER_SITE_OTHER): Payer: Medicare Other | Admitting: Family Medicine

## 2020-02-26 ENCOUNTER — Encounter: Payer: Self-pay | Admitting: Family Medicine

## 2020-02-26 ENCOUNTER — Other Ambulatory Visit: Payer: Self-pay

## 2020-02-26 DIAGNOSIS — I10 Essential (primary) hypertension: Secondary | ICD-10-CM

## 2020-02-26 DIAGNOSIS — M62838 Other muscle spasm: Secondary | ICD-10-CM | POA: Diagnosis not present

## 2020-02-26 MED ORDER — MELOXICAM 15 MG PO TABS: 15.0000 mg | ORAL_TABLET | Freq: Every day | ORAL | 0 refills | Status: DC

## 2020-02-26 MED ORDER — CYCLOBENZAPRINE HCL 5 MG PO TABS: 5.0000 mg | ORAL_TABLET | Freq: Three times a day (TID) | ORAL | 1 refills | Status: DC | PRN

## 2020-02-26 NOTE — Assessment & Plan Note (Signed)
Patient with elevated BP today likely worsened by her acute neck pain with anxiety. Will re eval next visit.

## 2020-02-26 NOTE — Progress Notes (Signed)
Established Patient Office Visit  SUBJECTIVE:  Subjective  Patient ID: Kathy Morgan, female    DOB: March 02, 1934  Age: 86 y.o. MRN: [id number]  CC:  Chief Complaint  Patient presents with  . Neck Pain    Patient having some pain in neck and neck feels stiff making it hard for her to turn her head x 2 days.     HPI Kathy Morgan is a 86 y.o. female presenting today for     Past Medical History:  Diagnosis Date  . Arthritis    lower back  . Hyperlipidemia   . Hypertension   . Vertigo   . Wears dentures    full upper    Past Surgical History:  Procedure Laterality Date  . BREAST SURGERY Right 2011   R cyst removal (benign)   . CATARACT EXTRACTION W/PHACO Right 01/05/2019   Procedure: CATARACT EXTRACTION PHACO AND INTRAOCULAR LENS PLACEMENT (IOC) RIGHT 2.88  00:29.7;  Surgeon: Nevada Crane, MD;  Location: [facility];  Service: Ophthalmology;  Laterality: Right;  . CATARACT EXTRACTION W/PHACO Left 02/23/2019   Procedure: CATARACT EXTRACTION PHACO AND INTRAOCULAR LENS PLACEMENT (IOC) LEFT 4.68  00:41.3;  Surgeon: Nevada Crane, MD;  Location: [facility];  Service: Ophthalmology;  Laterality: Left;  needs to stay 2nd  . VAGINAL HYSTERECTOMY  1991    Family History  Problem Relation Age of Onset  . Cancer Mother   . Dementia Father     Social History   Socioeconomic History  . Marital status: Married    Spouse name: Not on file  . Number of children: Not on file  . Years of education: Not on file  . Highest education level: Not on file  Occupational History  . Not on file  Tobacco Use  . Smoking status: Never Smoker  . Smokeless tobacco: Never Used  Vaping Use  . Vaping Use: Never used  Substance and Sexual Activity  . Alcohol use: Yes    Alcohol/week: 2.0 standard drinks    Types: 2 Glasses of wine per week  . Drug use: No  . Sexual activity: Never  Other Topics Concern  . Not on file  Social History Narrative  . Not on file    Social Determinants of Health   Financial Resource Strain: Not on file  Food Insecurity: Not on file  Transportation Needs: Not on file  Physical Activity: Not on file  Stress: Not on file  Social Connections: Not on file  Intimate Partner Violence: Not on file     Current Outpatient Medications:  .  amLODipine (NORVASC) 5 MG tablet, Take 5 mg by mouth daily., Disp: , Rfl:  .  atorvastatin (LIPITOR) 10 MG tablet, Take 10 mg by mouth daily., Disp: , Rfl:  .  cholecalciferol (VITAMIN D) 400 UNITS TABS tablet, Take 1,000 Units by mouth., Disp: , Rfl:  .  cyclobenzaprine (FLEXERIL) 5 MG tablet, Take 1 tablet (5 mg total) by mouth 3 (three) times daily as needed for muscle spasms., Disp: 30 tablet, Rfl: 1 .  meloxicam (MOBIC) 15 MG tablet, Take 1 tablet (15 mg total) by mouth daily., Disp: 30 tablet, Rfl: 0 .  metoprolol succinate (TOPROL-XL) 100 MG 24 hr tablet, Take 100 mg by mouth daily. Take with or immediately following a meal., Disp: , Rfl:    No Known Allergies  ROS Review of Systems  Constitutional: Negative.   HENT: Negative.   Respiratory: Negative.   Cardiovascular: Negative.   Genitourinary:  Negative.   Musculoskeletal: Positive for neck pain and neck stiffness.     OBJECTIVE:    Physical Exam Vitals and nursing note reviewed.  Constitutional:      Appearance: Normal appearance.  HENT:     Head: Normocephalic.     Nose: Nose normal.     Mouth/Throat:     Mouth: Mucous membranes are moist.  Eyes:     Conjunctiva/sclera: Conjunctivae normal.  Cardiovascular:     Rate and Rhythm: Normal rate. Rhythm irregular.     Pulses: Normal pulses.  Pulmonary:     Effort: Pulmonary effort is normal.  Musculoskeletal:        General: Tenderness present.     Cervical back: Tenderness present.  Skin:    General: Skin is warm.  Neurological:     General: No focal deficit present.     Mental Status: She is alert.  Psychiatric:        Mood and Affect: Mood normal.      There were no vitals taken for this visit. Wt Readings from Last 3 Encounters:  12/14/19 138 lb 4.8 oz (62.7 kg)  09/09/19 137 lb 4.8 oz (62.3 kg)  02/23/19 140 lb (63.5 kg)    Health Maintenance Due  Topic Date Due  . TETANUS/TDAP  Never done  . DEXA SCAN  Never done  . PNA vac Low Risk Adult (2 of 2 - PCV13) 09/10/2015  . INFLUENZA VACCINE  08/02/2019    There are no preventive care reminders to display for this patient.  CBC Latest Ref Rng & Units 12/04/2019 11/06/2018 12/17/2012  WBC 3.8 - 10.8 Thousand/uL 5.9 7.0 11.1(H)  Hemoglobin 11.7 - 15.5 g/dL 16.1 09.6 04.5  Hematocrit 35.0 - 45.0 % 40.0 42.6 39.4  Platelets 140 - 400 Thousand/uL 177 181 165   CMP Latest Ref Rng & Units 12/04/2019 11/06/2018 12/17/2012  Glucose 65 - 99 mg/dL 95 409(W) 98  BUN 7 - 25 mg/dL 17 15 11(B)  Creatinine 0.60 - 0.88 mg/dL 1.47 8.29 5.62  Sodium 135 - 146 mmol/L 141 142 136  Potassium 3.5 - 5.3 mmol/L 4.5 3.8 3.9  Chloride 98 - 110 mmol/L 105 106 102  CO2 20 - 32 mmol/L 24 26 27   Calcium 8.6 - 10.4 mg/dL 9.3 9.4 9.7  Total Protein 6.1 - 8.1 g/dL 8.0 ) -  Total Bilirubin 0.2 - 1.2 mg/dL 0.8 1.3(Y) -  Alkaline Phos 38 - 126 U/L - 87 -  AST 10 - 35 U/L 23 26 -  ALT 6 - 29 U/L 16 19 -    Lab Results  Component Value Date   TSH 3.72 12/04/2019   Lab Results  Component Value Date   ALBUMIN 4.6 11/06/2018   ANIONGAP 10 11/06/2018   Lab Results  Component Value Date   CHOL 173 11/06/2018   HDL 70 11/06/2018   LDLCALC 94 11/06/2018   CHOLHDL 2.5 11/06/2018   Lab Results  Component Value Date   TRIG 43 11/06/2018   No results found for: HGBA1C    ASSESSMENT & PLAN:   Problem List Items Addressed This Visit      Cardiovascular and Mediastinum   Hypertension    Patient with elevated BP today likely worsened by her acute neck pain with anxiety. Will re eval next visit.         Musculoskeletal and Integument   Neck muscle spasm - Primary    Plan- Low dose muscle  relaxer, Meloxicam and heat 4-5  x per days for 20 min at a time.          Meds ordered this encounter  Medications  . meloxicam (MOBIC) 15 MG tablet    Sig: Take 1 tablet (15 mg total) by mouth daily.    Dispense:  30 tablet    Refill:  0  . cyclobenzaprine (FLEXERIL) 5 MG tablet    Sig: Take 1 tablet (5 mg total) by mouth 3 (three) times daily as needed for muscle spasms.    Dispense:  30 tablet    Refill:  1      Follow-up: No follow-ups on file.    Irish Lack, FNP [facility] [address] [place], [place] [address]

## 2020-02-26 NOTE — Assessment & Plan Note (Addendum)
Plan- Low dose muscle relaxer, Meloxicam and heat 4-5 x per days for 20 min at a time.

## 2020-03-14 ENCOUNTER — Ambulatory Visit: Payer: Medicare Other | Admitting: Internal Medicine

## 2020-03-17 ENCOUNTER — Encounter: Payer: Self-pay | Admitting: Family Medicine

## 2020-03-17 ENCOUNTER — Ambulatory Visit (INDEPENDENT_AMBULATORY_CARE_PROVIDER_SITE_OTHER): Payer: Medicare Other | Admitting: Family Medicine

## 2020-03-17 ENCOUNTER — Other Ambulatory Visit: Payer: Self-pay

## 2020-03-17 VITALS — BP 118/62 | HR 72 | Ht 61.5 in | Wt 127.5 lb

## 2020-03-17 DIAGNOSIS — M62838 Other muscle spasm: Secondary | ICD-10-CM

## 2020-03-17 DIAGNOSIS — I10 Essential (primary) hypertension: Secondary | ICD-10-CM | POA: Diagnosis not present

## 2020-03-17 NOTE — Assessment & Plan Note (Signed)
Patient's blood pressure is within the desired range. Medication side effects include: no side effects noted Continue current treatment regimen.  

## 2020-03-17 NOTE — Progress Notes (Signed)
Established Patient Office Visit  SUBJECTIVE:  Subjective  Patient ID: Kathy Morgan, female    DOB: Nov 30, 1934  Age: 86 y.o. MRN: [id number]  CC:  Chief Complaint  Patient presents with  . Follow-up    Patient here for 2 week follow up for neck pain     HPI Kathy Morgan is a 86 y.o. female presenting today for     Past Medical History:  Diagnosis Date  . Arthritis    lower back  . Hyperlipidemia   . Hypertension   . Vertigo   . Wears dentures    full upper    Past Surgical History:  Procedure Laterality Date  . BREAST SURGERY Right 2011   R cyst removal (benign)   . CATARACT EXTRACTION W/PHACO Right 01/05/2019   Procedure: CATARACT EXTRACTION PHACO AND INTRAOCULAR LENS PLACEMENT (IOC) RIGHT 2.88  00:29.7;  Surgeon: Nevada Crane, MD;  Location: [facility];  Service: Ophthalmology;  Laterality: Right;  . CATARACT EXTRACTION W/PHACO Left 02/23/2019   Procedure: CATARACT EXTRACTION PHACO AND INTRAOCULAR LENS PLACEMENT (IOC) LEFT 4.68  00:41.3;  Surgeon: Nevada Crane, MD;  Location: [facility];  Service: Ophthalmology;  Laterality: Left;  needs to stay 2nd  . VAGINAL HYSTERECTOMY  1991    Family History  Problem Relation Age of Onset  . Cancer Mother   . Dementia Father     Social History   Socioeconomic History  . Marital status: Married    Spouse name: Not on file  . Number of children: Not on file  . Years of education: Not on file  . Highest education level: Not on file  Occupational History  . Not on file  Tobacco Use  . Smoking status: Never Smoker  . Smokeless tobacco: Never Used  Vaping Use  . Vaping Use: Never used  Substance and Sexual Activity  . Alcohol use: Yes    Alcohol/week: 2.0 standard drinks    Types: 2 Glasses of wine per week  . Drug use: No  . Sexual activity: Never  Other Topics Concern  . Not on file  Social History Narrative  . Not on file   Social Determinants of Health   Financial Resource  Strain: Not on file  Food Insecurity: Not on file  Transportation Needs: Not on file  Physical Activity: Not on file  Stress: Not on file  Social Connections: Not on file  Intimate Partner Violence: Not on file     Current Outpatient Medications:  .  amLODipine (NORVASC) 5 MG tablet, Take 5 mg by mouth daily., Disp: , Rfl:  .  atorvastatin (LIPITOR) 10 MG tablet, Take 10 mg by mouth daily., Disp: , Rfl:  .  cholecalciferol (VITAMIN D) 400 UNITS TABS tablet, Take 1,000 Units by mouth., Disp: , Rfl:  .  cyclobenzaprine (FLEXERIL) 5 MG tablet, Take 1 tablet (5 mg total) by mouth 3 (three) times daily as needed for muscle spasms., Disp: 30 tablet, Rfl: 1 .  meloxicam (MOBIC) 15 MG tablet, Take 1 tablet (15 mg total) by mouth daily., Disp: 30 tablet, Rfl: 0 .  metoprolol succinate (TOPROL-XL) 100 MG 24 hr tablet, Take 100 mg by mouth daily. Take with or immediately following a meal., Disp: , Rfl:    No Known Allergies  ROS Review of Systems  Constitutional: Negative.   HENT: Negative.   Respiratory: Negative.   Cardiovascular: Negative.   Musculoskeletal: Positive for neck stiffness.     OBJECTIVE:    Physical  Exam Constitutional:      Appearance: Normal appearance.  HENT:     Mouth/Throat:     Mouth: Mucous membranes are moist.  Cardiovascular:     Rate and Rhythm: Normal rate and regular rhythm.  Musculoskeletal:        General: Tenderness present.  Skin:    General: Skin is warm.     BP 118/62   Pulse 72   Ht 5' 1.5" (1.562 m)   Wt 127 lb 8 oz (57.8 kg)   BMI 23.70 kg/m  Wt Readings from Last 3 Encounters:  03/17/20 127 lb 8 oz (57.8 kg)  12/14/19 138 lb 4.8 oz (62.7 kg)  09/09/19 137 lb 4.8 oz (62.3 kg)    Health Maintenance Due  Topic Date Due  . TETANUS/TDAP  Never done  . DEXA SCAN  Never done  . PNA vac Low Risk Adult (2 of 2 - PCV13) 09/10/2015  . INFLUENZA VACCINE  08/02/2019    There are no preventive care reminders to display for this  patient.  CBC Latest Ref Rng & Units 12/04/2019 11/06/2018 12/17/2012  WBC 3.8 - 10.8 Thousand/uL 5.9 7.0 11.1(H)  Hemoglobin 11.7 - 15.5 g/dL 35.5 97.4 16.3  Hematocrit 35.0 - 45.0 % 40.0 42.6 39.4  Platelets 140 - 400 Thousand/uL 177 181 165   CMP Latest Ref Rng & Units 12/04/2019 11/06/2018 12/17/2012  Glucose 65 - 99 mg/dL 95 845(X) 98  BUN 7 - 25 mg/dL 17 15 64(W)  Creatinine 0.60 - 0.88 mg/dL 8.03 2.12 2.48  Sodium 135 - 146 mmol/L 141 142 136  Potassium 3.5 - 5.3 mmol/L 4.5 3.8 3.9  Chloride 98 - 110 mmol/L 105 106 102  CO2 20 - 32 mmol/L 24 26 27   Calcium 8.6 - 10.4 mg/dL 9.3 9.4 9.7  Total Protein 6.1 - 8.1 g/dL 8.0 ) -  Total Bilirubin 0.2 - 1.2 mg/dL 0.8 2.5(O) -  Alkaline Phos 38 - 126 U/L - 87 -  AST 10 - 35 U/L 23 26 -  ALT 6 - 29 U/L 16 19 -    Lab Results  Component Value Date   TSH 3.72 12/04/2019   Lab Results  Component Value Date   ALBUMIN 4.6 11/06/2018   ANIONGAP 10 11/06/2018   Lab Results  Component Value Date   CHOL 173 11/06/2018   HDL 70 11/06/2018   LDLCALC 94 11/06/2018   CHOLHDL 2.5 11/06/2018   Lab Results  Component Value Date   TRIG 43 11/06/2018   No results found for: HGBA1C    ASSESSMENT & PLAN:   Problem List Items Addressed This Visit      Cardiovascular and Mediastinum   Hypertension - Primary    Patient's blood pressure is within the desired range. Medication side effects include: no side effects noted Continue current treatment regimen.         Musculoskeletal and Integument   Neck muscle spasm    Patient neck improved for a while now she is having pain on the R side of her neck. No trauma, she has been sleeping with the same pillows for years.  Plan- Start Flexeril again and replace pillows with a more supportive pillow at bed bath and beyond. Massage Therapy encouraged as well.          No orders of the defined types were placed in this encounter.     Follow-up: No follow-ups on file.    13/05/2018, FNP [facility]  [address] [place], [place] [address]

## 2020-03-17 NOTE — Assessment & Plan Note (Signed)
Patient neck improved for a while now she is having pain on the R side of her neck. No trauma, she has been sleeping with the same pillows for years.  Plan- Start Flexeril again and replace pillows with a more supportive pillow at bed bath and beyond. Massage Therapy encouraged as well.

## 2020-03-20 ENCOUNTER — Other Ambulatory Visit: Payer: Self-pay | Admitting: Family Medicine

## 2020-04-14 ENCOUNTER — Other Ambulatory Visit: Payer: Self-pay | Admitting: *Deleted

## 2020-04-14 MED ORDER — METOPROLOL SUCCINATE ER 100 MG PO TB24: 100.0000 mg | ORAL_TABLET | Freq: Every day | ORAL | 3 refills | Status: DC

## 2020-04-14 MED ORDER — ATORVASTATIN CALCIUM 10 MG PO TABS: 10.0000 mg | ORAL_TABLET | Freq: Every day | ORAL | 3 refills | Status: DC

## 2020-04-19 ENCOUNTER — Other Ambulatory Visit: Payer: Self-pay

## 2020-04-19 MED ORDER — AMLODIPINE BESYLATE 5 MG PO TABS: 5.0000 mg | ORAL_TABLET | Freq: Every day | ORAL | 2 refills | Status: DC

## 2020-04-21 ENCOUNTER — Other Ambulatory Visit: Payer: Self-pay | Admitting: Internal Medicine

## 2020-04-21 ENCOUNTER — Other Ambulatory Visit: Payer: Self-pay

## 2020-04-21 MED ORDER — METOPROLOL TARTRATE 100 MG PO TABS: 100.0000 mg | ORAL_TABLET | Freq: Every day | ORAL | 1 refills | Status: DC

## 2020-04-22 ENCOUNTER — Other Ambulatory Visit: Payer: Self-pay | Admitting: *Deleted

## 2020-04-22 MED ORDER — METOPROLOL TARTRATE 100 MG PO TABS: 100.0000 mg | ORAL_TABLET | Freq: Every day | ORAL | 3 refills | Status: DC

## 2020-04-22 MED ORDER — AMLODIPINE BESYLATE 5 MG PO TABS: 5.0000 mg | ORAL_TABLET | Freq: Every day | ORAL | 3 refills | Status: DC

## 2020-04-22 MED ORDER — ATORVASTATIN CALCIUM 10 MG PO TABS: 10.0000 mg | ORAL_TABLET | Freq: Every day | ORAL | 3 refills | Status: DC

## 2020-04-25 ENCOUNTER — Other Ambulatory Visit: Payer: Self-pay | Admitting: *Deleted

## 2020-04-25 MED ORDER — AMLODIPINE BESYLATE 5 MG PO TABS: 10.0000 mg | ORAL_TABLET | Freq: Every day | ORAL | 3 refills | Status: DC

## 2020-06-15 ENCOUNTER — Ambulatory Visit (INDEPENDENT_AMBULATORY_CARE_PROVIDER_SITE_OTHER): Payer: Medicare Other | Admitting: Internal Medicine

## 2020-06-15 ENCOUNTER — Other Ambulatory Visit: Payer: Self-pay

## 2020-06-15 VITALS — BP 130/80 | HR 70 | Wt 127.0 lb

## 2020-06-15 DIAGNOSIS — R42 Dizziness and giddiness: Secondary | ICD-10-CM | POA: Diagnosis not present

## 2020-06-15 DIAGNOSIS — E7800 Pure hypercholesterolemia, unspecified: Secondary | ICD-10-CM

## 2020-06-15 DIAGNOSIS — Z20822 Contact with and (suspected) exposure to covid-19: Secondary | ICD-10-CM

## 2020-06-15 DIAGNOSIS — S43421A Sprain of right rotator cuff capsule, initial encounter: Secondary | ICD-10-CM

## 2020-06-15 DIAGNOSIS — I10 Essential (primary) hypertension: Secondary | ICD-10-CM

## 2020-06-15 LAB — POC COVID19 BINAXNOW: SARS Coronavirus 2 Ag: POSITIVE — AB

## 2020-06-15 NOTE — Patient Instructions (Signed)
COVID-19: Quarantine and Isolation Quarantine If you were exposed Quarantine and stay away from others when you have been in close contact with someone whohas COVID-19. Isolate If you are sick or test positive Isolate when you are sick or when you have COVID-19, even if you don't have symptoms. When to stay home Calculating quarantine The date of your exposure is considered day 0. Day 1 is the first full day after your last contact with a person who has had COVID-19. Stay home and away from other people for at least 5 days. Learn why CDC updated guidance for the general public. IF YOU were exposed to COVID-19 and are NOT up-to-date IF YOU were exposed to COVID-19 and are NOT on COVID-19 vaccinations Quarantine for at least 5 days Stay home Stay home and quarantine for at least 5 full days. Wear a well-fitted mask if you must be around others in your home. Do not travel. Get tested Even if you don't develop symptoms, get tested at least 5 days after you last had close contact with someone with COVID-19. After quarantine Watch for symptoms Watch for symptoms until 10 days after you last had close contact with someone with COVID-19. Avoid travel It is best to avoid travel until a full 10 days after you last had close contact with someone with COVID-19. If you develop symptoms Isolate immediately and get tested. Continue to stay home until you know the results. Wear a well-fitted mask around others. Take precautions until day 10 Wear a mask Wear a well-fitted mask for 10 full days any time you are around others inside your home or in public. Do not go to places where you are unable to wear a mask. If you must travel during days 6-10, take precautions. Avoid being around people who are at high risk IF YOU were exposed to COVID-19 and are up-to-date IF YOU were exposed to COVID-19 and are on COVID-19 vaccinations No quarantine You do not need to stay home unless you develop  symptoms. Get tested Even if you don't develop symptoms, get tested at least 5 days after you last had close contact with someone with COVID-19. Watch for symptoms Watch for symptoms until 10 days after you last had close contact with someone with COVID-19. If you develop symptoms Isolate immediately and get tested. Continue to stay home until you know the results. Wear a well-fitted mask around others. Take precautions until day 10 Wear a mask Wear a well-fitted mask for 10 full days any time you are around others inside your home or in public. Do not go to places where you are unable to wear a mask. Take precautions if traveling Avoid being around people who are at high risk IF YOU were exposed to COVID-19 and had confirmed COVID-19 within the past 90 days (you tested positive using a viral test) No quarantine You do not need to stay home unless you develop symptoms. Watch for symptoms Watch for symptoms until 10 days after you last had close contact with someone with COVID-19. If you develop symptoms Isolate immediately and get tested. Continue to stay home until you know the results. Wear a well-fitted mask around others. Take precautions until day 10 Wear a mask Wear a well-fitted mask for 10 full days any time you are around others inside your home or in public. Do not go to places where you are unable to wear a mask. Take precautions if traveling Avoid being around people who are at high risk Calculating isolation   Day 0 is your first day of symptoms or a positive viral test. Day 1 is the first full day after your symptoms developed or your test specimen was collected. If you have COVID-19 or have symptoms, isolate for at least 5 days. IF YOU tested positive for COVID-19 or have symptoms, regardless of vaccination status Stay home for at least 5 days Stay home for 5 days and isolate from others in your home. Wear a well-fitted mask if you must be around others in your home. Do not  travel. Ending isolation if you had symptoms End isolation after 5 full days if you are fever-free for 24 hours (without the use of fever-reducing medication) and your symptoms are improving. Ending isolation if you did NOT have symptoms End isolation after at least 5 full days after your positive test. If you were severely ill with COVID-19 or are immunocompromised You should isolate for at least 10 days. Consult your doctor before ending isolation. Take precautions until day 10 Wear a mask Wear a well-fitted mask for 10 full days any time you are around others inside your home or in public. Do not go to places where you are unable to wear a mask. Do not travel Do not travel until a full 10 days after your symptoms started or the date your positive test was taken if you had no symptoms. Avoid being around people who are at high risk Definitions Exposure Contact with someone infected with SARS-CoV-2, the virus that causes COVID-19,in a way that increases the likelihood of getting infected with the virus. Close contact A close contact is someone who was less than 6 feet away from an infected person (laboratory-confirmed or a clinical diagnosis) for a cumulative total of 15 minutes or more over a 24-hour period. For example, three individual 5-minute exposures for a total of 15 minutes. People who are exposed to someone with COVID-19 after they completed at least 5 days of isolation are notconsidered close contacts. Quarantine Quarantine is a strategy used to prevent transmission of COVID-19 by keeping people who have been in close contact with someone with COVID-19 apart from others. Who does not need to quarantine? If you had close contact with someone with COVID-19 and you are in one of the following groups, you do not need to quarantine. You are up to date with your COVID-19 vaccines. You had confirmed COVID-19 within the last 90 days (meaning you tested positive using a viral test). You  should wear a well-fitting mask around others for 10 days from the date of your last close contact with someone with COVID-19 (the date of last close contact is considered day 0). Get tested at least 5 days after you last had close contact with someone with COVID-19. If you test positive or develop COVID-19 symptoms, isolate from other people and follow recommendations in the Isolation section below. If you tested positive for COVID-19 with a viral test within the previous 90 days and subsequently recovered and remain without COVID-19 symptoms, you do not need to quarantine or get tested after close contact. You should wear a well-fitting mask around others for 10 days from the date of your last close contact withsomeone with COVID-19 (the date of last close contact is considered day 0). Who should quarantine? If you come into close contact with someone with COVID-19, you should quarantine if you are not up to date on COVID-19 vaccines. This includes people who are not vaccinated. What to do for quarantine Stay home and away   from other people for at least 5 days (day 0 through day 5) after your last contact with a person who has COVID-19. The date of your exposure is considered day 0. Wear a well-fitting mask when around others at home, if possible. For 10 days after your last close contact with someone with COVID-19, watch for fever (100.4F or greater), cough, shortness of breath, or other COVID-19 symptoms. If you develop symptoms, get tested immediately and isolate until you receive your test results. If you test positive, follow isolation recommendations. If you do not develop symptoms, get tested at least 5 days after you last had close contact with someone with COVID-19. If you test negative, you can leave your home, but continue to wear a well-fitting mask when around others at home and in public until 10 days after your last close contact with someone with COVID-19. If you test positive, you should  isolate for at least 5 days from the date of your positive test (if you do not have symptoms). If you do develop COVID-19 symptoms, isolate for at least 5 days from the date your symptoms began (the date the symptoms started is day 0). Follow recommendations in the isolation section below. If you are unable to get a test 5 days after last close contact with someone with COVID-19, you can leave your home after day 5 if you have been without COVID-19 symptoms throughout the 5-day period. Wear a well-fitting mask for 10 days after your date of last close contact when around others at home and in public. Avoid people who are immunocompromised or at high risk for severe disease, and nursing homes and other high-risk settings, until after at least 10 days. If possible, stay away from people you live with, especially people who are at higher risk for getting very sick from COVID-19, as well as others outside your home throughout the full 10 days after your last close contact with someone with COVID-19. If you are unable to quarantine, you should wear a well-fitting mask for 10 days when around others at home and in public. If you are unable to wear a mask when around others, you should continue to quarantine for 10 days. Avoid people who are immunocompromised or at high risk for severe disease, and nursing homes and other high-risk settings, until after at least 10 days. See additional information about travel. Do not go to places where you are unable to wear a mask, such as restaurants and some gyms, and avoid eating around others at home and at work until after 10 days after your last close contact with someone with COVID-19. After quarantine Watch for symptoms until 10 days after your last close contact with someone with COVID-19. If you have symptoms, isolate immediately and get tested. Quarantine in high-risk congregate settings In certain congregate settings that have high risk of secondary transmission  (such as correctional and detention facilities, homeless shelters, or cruise ships), CDC recommends a 10-day quarantine for residents, regardless of vaccination and booster status. During periods of critical staffing shortages, facilities may consider shortening the quarantine period for staff to ensure continuity of operations. Decisions to shorten quarantine in these settings should be made in consultation with state, local, tribal, or territorial health departments and should take into consideration the context and characteristics of the facility. CDC's setting-specific guidance provides additional recommendations for these settings. Isolation Isolation is used to separate people with confirmed or suspected COVID-19 from those without COVID-19. People who are in isolation should stay   home until it's safe for them to be around others. At home, anyone sick or infected should separate from others, or wear a well-fitting mask when they need to be around others. People in isolation should stay in a specific "sick room" or area and use a separate bathroom if available. Everyone who has presumed or confirmed COVID-19 should stay home and isolate from other people for at least 5 full days (day 0 is the first day of symptoms or the date of the day of the positive viral test for asymptomatic persons). They should wear a mask when around others at home and in public for an additional 5 days. People who are confirmed to have COVID-19 or are showing symptoms of COVID-19 need to isolate regardless of their vaccination status. This includes: People who have a positive viral test for COVID-19, regardless of whether or not they have symptoms. People with symptoms of COVID-19, including people who are awaiting test results or have not been tested. People with symptoms should isolate even if they do not know if they have been in close contact with someone with COVID-19. What to do for isolation Monitor your symptoms. If you  have an emergency warning sign (including trouble breathing), seek emergency medical care immediately. Stay in a separate room from other household members, if possible. Use a separate bathroom, if possible. Take steps to improve ventilation at home, if possible. Avoid contact with other members of the household and pets. Don't share personal household items, like cups, towels, and utensils. Wear a well-fitting mask when you need to be around other people. Learn more about what to do if you are sick and how to notify your contacts. Ending isolation for people who had COVID-19 and had symptoms If you had COVID-19 and had symptoms, isolate for at least 5 days. To calculate your 5-day isolation period, day 0 is your first day of symptoms. Day 1 is the first full day after your symptoms developed. You can leave isolation after 5 full days. You can end isolation after 5 full days if you are fever-free for 24 hours without the use of fever-reducing medication and your other symptoms have improved (Loss of taste and smell may persist for weeks or months after recovery and need not delay the end of isolation). You should continue to wear a well-fitting mask around others at home and in public for 5 additional days (day 6 through day 10) after the end of your 5-day isolation period. If you are unable to wear a mask when around others, you should continue to isolate for a full 10 days. Avoid people who are immunocompromised or at high risk for severe disease, and nursing homes and other high-risk settings, until after at least 10 days. If you continue to have fever or your other symptoms have not improved after 5 days of isolation, you should wait to end your isolation until you are fever-free for 24 hours without the use of fever-reducing medication and your other symptoms have improved. Continue to wear a well-fitting mask. Contact your healthcare provider if you have questions. See additional information about  travel. Do not go to places where you are unable to wear a mask, such as restaurants and some gyms, and avoid eating around others at home and at work until a full 10 days after your first day of symptoms. If an individual has access to a test and wants to test, the best approach is to use an antigen test1 towards the end of   the 5-day isolation period. Collect the test sample only if you are fever-free for 24 hours without the use of fever-reducing medication and your other symptoms have improved (loss of taste and smell may persist for weeks or months after recovery and need not delay the end of isolation). If your test result is positive, you should continue to isolate until day 10. If your test result is negative, you can end isolation, but continue to wear a well-fitting mask around others at home and in public until day 10. Follow additional recommendations for masking and avoiding travel as described above. 1As noted in the labeling for authorized over-the counter antigen tests: Negative results should be treated as presumptive. Negative results do not rule out SARS-CoV-2 infection and should not be used as the sole basis for treatment or patient management decisions, including infection control decisions. To improve results, antigen tests should be used twice over a three-day period with at least 24 hours and no more than 48 hours between tests. Note that these recommendations on ending isolation do not apply to people with moderate or severe COVID-19 or with weakened immune systems (immunocompromised). See section below for recommendations for when toend isolation for these groups. Ending isolation for people who tested positive for COVID-19 but had no symptoms If you test positive for COVID-19 and never develop symptoms, isolate for at least 5 days. Day 0 is the day of your positive viral test (based on the date you were tested) and day 1 is the first full day after the specimen was collected for your  positive test. You can leave isolation after 5 full days. If you continue to have no symptoms, you can end isolation after at least 5 days. You should continue to wear a well-fitting mask around others at home and in public until day 10 (day 6 through day 10). If you are unable to wear a mask when around others, you should continue to isolate for 10 days. Avoid people who are immunocompromised or at high risk for severe disease, and nursing homes and other high-risk settings, until after at least 10 days. If you develop symptoms after testing positive, your 5-day isolation period should start over. Day 0 is your first day of symptoms. Follow the recommendations above for ending isolation for people who had COVID-19 and had symptoms. See additional information about travel. Do not go to places where you are unable to wear a mask, such as restaurants and some gyms, and avoid eating around others at home and at work until 10 days after the day of your positive test. If an individual has access to a test and wants to test, the best approach is to use an antigen test1 towards the end of the 5-day isolation period. If your test result is positive, you should continue to isolate until day 10. If your test result is negative, you can end isolation, but continue to wear a well-fitting mask around others at home and in public until day 10. Follow additionalrecommendations for masking and avoiding travel as described above. 1As noted in the labeling for authorized over-the counter antigen tests: Negative results should be treated as presumptive. Negative results do not rule out SARS-CoV-2 infection and should not be used as the sole basis for treatment or patient management decisions, including infection control decisions. To improve results, antigen tests should be used twice over a three-day period with at least 24 hours and no more than 48 hours between tests. Ending isolation for people who were   severely ill with  COVID-19 or have a weakened immune system (immunocompromised) People who are severely ill with COVID-19 (including those who were hospitalized or required intensive care or ventilation support) and people with compromised immune systems might need to isolate at home longer. They may also require testing with a viral test to determine when they can be around others. CDC recommends an isolation period of at least 10 and up to 20 days for people who were severely ill with COVID-19 and for people with weakened immune systems. Consult with your healthcare provider about when you can resume being aroundother people. People who are immunocompromised should talk to their healthcare provider about the potential for reduced immune responses to COVID-19 vaccines and the need to continue to follow current prevention measures (including wearing a well-fitting mask, staying 6 feet apart from others they don't live with, and avoiding crowds and poorly ventilated indoor spaces) to protect themselves against COVID-19 until advised otherwise by their healthcare provider. Close contacts of immunocompromised people--including household members--should also be encouraged to receive all recommended COVID-19 vaccine doses to help protect these people. Isolation in high-risk congregate settings In certain high-risk congregate settings that have high risk of secondary transmission and where it is not feasible to cohort people (such as correctional and detention facilities, homeless shelters, and cruise ships), CDC recommends a 10-day isolation period for residents. During periods of critical staffing shortages, facilities may consider shortening the isolation period for staff to ensure continuity of operations. Decisions to shorten isolation in these settings should be made in consultation with state, local, tribal, or territorial health departments and should take into consideration the context and characteristics of the facility.  CDC's setting-specific guidance provides additional recommendations for these settings. This CDC guidance is meant to supplement--not replace--any federal, state, local,territorial, or tribal health and safety laws, rules, and regulations. Recommendations for specific settings These recommendations do not apply to healthcare professionals. For guidance specific to these settings, see Healthcare professionals: Interim Guidance for Managing Healthcare Personnel with SARS-CoV-2 Infection or Exposure to SARS-CoV-2 Patients, residents, and visitors to healthcare settings: Interim Infection Prevention and Control Recommendations for Healthcare Personnel During the Coronavirus Disease 2019 (COVID-19) Pandemic Additional setting-specific guidance and recommendations are available. These recommendations on quarantine and isolation do apply to K-12 School settings. Additional guidance is available here: Overview of COVID-19 Quarantine for K-12 Schools Travelers: Travel information and recommendations Congregate facilities and other settings: guidance pages for community, work, and school settings Ongoing COVID-19 exposure FAQs I live with someone with COVID-19, but I cannot be separated from them. How do we manage quarantine in this situation? It is very important for people with COVID-19 to remain apart from other people, if possible, even if they are living together. If separation of the person with COVID-19 from others that they live with is not possible, the other people that they live with will have ongoing exposure, meaning they will be repeatedly exposed until that person is no longer able to spread the virus to other people. In this situation, there are precautions you can take to limit the spread of COVID-19: The person with COVID-19 and everyone they live with should wear a well-fitting mask inside the home. If possible, one person should care for the person with COVID-19 to limit the number of people  who are in close contact with the infected person. Take steps to protect yourself and others to reduce transmission in the home: Quarantine if you are not up to date with your COVID-19   vaccines. Isolate if you are sick or tested positive for COVID-19, even if you don't have symptoms. Learn more about the public health recommendations for testing, mask use and quarantine of close contacts, like yourself, who have ongoing exposure. These recommendations differ depending on your vaccination status. What should I do if I have ongoing exposure to COVID-19 from someone I live with? Recommendations for this situation depend on your vaccination status: If you are not up to date on COVID-19 vaccines and have ongoing exposure to COVID-19, you should: Begin quarantine immediately and continue to quarantine throughout the isolation period of the person with COVID-19. Continue to quarantine for an additional 5 days starting the day after the end of isolation for the person with COVID-19. Get tested at least 5 days after the end of isolation of the infected person that lives with them. If you test negative, you can leave the home but should continue to wear a well-fitting mask when around others at home and in public until 10 days after the end of isolation for the person with COVID-19. Isolate immediately if you develop symptoms of COVID-19 or test positive. If you are up to date with COVID-19 vaccines and have ongoing exposure to COVID-19, you should: Get tested at least 5 days after your first exposure. A person with COVID-19 is considered infectious starting 2 days before they develop symptoms, or 2 days before the date of their positive test if they do not have symptoms. Get tested again at least 5 days after the end of isolation for the person with COVID-19. Wear a well-fitting mask when you are around the person with COVID-19, and do this throughout their isolation period. Wear a well-fitting mask around  others for 10 days after the infected person's isolation period ends. Isolate immediately if you develop symptoms of COVID-19 or test positive. What should I do if multiple people I live with test positive for COVID-19 at different times? Recommendations for this situation depend on your vaccination status: If you are not up to date with your COVID-19 vaccines, you should: Quarantine throughout the isolation period of any infected person that you live with. Continue to quarantine until 5 days after the end of isolation date for the most recently infected person that lives with you. For example, if the last day of isolation of the person most recently infected with COVID-19 was June 30, the new 5-day quarantine period starts on July 1. Get tested at least 5 days after the end of isolation for the most recently infected person that lives with you. Wear a well-fitting mask when you are around any person with COVID-19 while that person is in isolation. Wear a well-fitting mask when you are around other people until 10 days after your last close contact. Isolate immediately if you develop symptoms of COVID-19 or test positive. If you are up to date with COVID-19 your vaccines , you should: Get tested at least 5 days after your first exposure. A person with COVID-19 is considered infectious starting 2 days before they developed symptoms, or 2 days before the date of their positive test if they do not have symptoms. Get tested again at least 5 days after the end of isolation for the most recently infected person that lives with you. Wear a well-fitting mask when you are around any person with COVID-19 while that person is in isolation. Wear a well-fitting mask around others for 10 days after the end of isolation for the most recently infected person that   lives with you. For example, if the last day of isolation for the person most recently infected with COVID-19 was June 30, the new 10-day period to wear a  well-fitting mask indoors in public starts on July 1. Isolate immediately if you develop symptoms of COVID-19 or test positive. I had COVID-19 and completed isolation. Do I have to quarantine or get tested if someone I live with gets COVID-19 shortly after I completed isolation? No. If you recently completed isolation and someone that lives with you tests positive for the virus that causes COVID-19 shortly after the end of your isolation period, you do not have to quarantine or get tested as long as you do not develop new symptoms. Once all of the people that live together have completed isolation or quarantine, refer to the guidance below for new exposures to COVID-19. If you had COVID-19 in the previous 90 days and then came into close contact with someone with COVID-19, you do not have to quarantine or get tested if you do not have symptoms. But you should: Wear a well-fitting mask indoors in public for 10 days after exposure. Monitor for COVID-19 symptoms and isolate immediately if symptoms develop. Consult with a healthcare provider for testing recommendations if new symptoms develop. If more than 90 days have passed since your recovery from infection, follow CDC's recommendations for close contacts. These recommendations will differ depending on your vaccination status. 01/28/2020 Content source: National Center for Immunization and Respiratory Diseases (NCIRD), Division of Viral Diseases This information is not intended to replace advice given to you by your health care provider. Make sure you discuss any questions you have with your healthcare provider. Document Revised: 02/05/2020 Document Reviewed: 02/05/2020 Elsevier Patient Education  2022 Elsevier Inc.  

## 2020-06-16 ENCOUNTER — Encounter: Payer: Self-pay | Admitting: Internal Medicine

## 2020-06-16 NOTE — Assessment & Plan Note (Signed)

## 2020-06-16 NOTE — Assessment & Plan Note (Signed)
Mediterranean diet

## 2020-06-16 NOTE — Assessment & Plan Note (Signed)
Patient was suggested rotator cuff exercises. 

## 2020-06-16 NOTE — Assessment & Plan Note (Signed)
Patient was advised to use sleep pillow to sleep at night

## 2020-06-16 NOTE — Progress Notes (Signed)
Established Patient Office Visit  Subjective:  Patient ID: Kathy Morgan, female    DOB: 1934/11/04  Age: 86 y.o. MRN: [id number]  CC:  Chief Complaint  Patient presents with   URI    URI    Kathy Morgan presents for general checkup.  She is known to have problem also has a high cholesterol hypertension with hypertensive cardiovascular disease.  She is fairly active and can walk around 1 or 2 blocks without any problem  Past Medical History:  Diagnosis Date   Arthritis    lower back   Hyperlipidemia    Hypertension    Vertigo    Wears dentures    full upper    Past Surgical History:  Procedure Laterality Date   BREAST SURGERY Right 2011   R cyst removal (benign)    CATARACT EXTRACTION W/PHACO Right 01/05/2019   Procedure: CATARACT EXTRACTION PHACO AND INTRAOCULAR LENS PLACEMENT (IOC) RIGHT 2.88  00:29.7;  Surgeon: Nevada Crane, MD;  Location: [facility];  Service: Ophthalmology;  Laterality: Right;   CATARACT EXTRACTION W/PHACO Left 02/23/2019   Procedure: CATARACT EXTRACTION PHACO AND INTRAOCULAR LENS PLACEMENT (IOC) LEFT 4.68  00:41.3;  Surgeon: Nevada Crane, MD;  Location: [facility];  Service: Ophthalmology;  Laterality: Left;  needs to stay 2nd   VAGINAL HYSTERECTOMY  1991    Family History  Problem Relation Age of Onset   Cancer Mother    Dementia Father     Social History   Socioeconomic History   Marital status: Married    Spouse name: Not on file   Number of children: Not on file   Years of education: Not on file   Highest education level: Not on file  Occupational History   Not on file  Tobacco Use   Smoking status: Never   Smokeless tobacco: Never  Vaping Use   Vaping Use: Never used  Substance and Sexual Activity   Alcohol use: Yes    Alcohol/week: 2.0 standard drinks    Types: 2 Glasses of wine per week   Drug use: No   Sexual activity: Never  Other Topics Concern   Not on file  Social History Narrative    Not on file   Social Determinants of Health   Financial Resource Strain: Not on file  Food Insecurity: Not on file  Transportation Needs: Not on file  Physical Activity: Not on file  Stress: Not on file  Social Connections: Not on file  Intimate Partner Violence: Not on file     Current Outpatient Medications:    amLODipine (NORVASC) 5 MG tablet, Take 2 tablets (10 mg total) by mouth daily., Disp: 90 tablet, Rfl: 3   atorvastatin (LIPITOR) 10 MG tablet, Take 1 tablet (10 mg total) by mouth daily., Disp: 90 tablet, Rfl: 3   cholecalciferol (VITAMIN D) 400 UNITS TABS tablet, Take 1,000 Units by mouth., Disp: , Rfl:    cyclobenzaprine (FLEXERIL) 5 MG tablet, Take 1 tablet (5 mg total) by mouth 3 (three) times daily as needed for muscle spasms., Disp: 30 tablet, Rfl: 1   meloxicam (MOBIC) 15 MG tablet, TAKE 1 TABLET (15 MG TOTAL) BY MOUTH DAILY., Disp: 30 tablet, Rfl: 0   metoprolol tartrate (LOPRESSOR) 100 MG tablet, TAKE 1 TABLET DAILY., Disp: 90 tablet, Rfl: 3   metoprolol tartrate (LOPRESSOR) 100 MG tablet, Take 1 tablet (100 mg total) by mouth daily., Disp: 90 tablet, Rfl: 3   No Known Allergies  ROS Review of Systems  Constitutional: Negative.   HENT: Negative.    Eyes: Negative.   Respiratory: Negative.    Cardiovascular: Negative.   Gastrointestinal: Negative.   Endocrine: Negative.   Genitourinary: Negative.   Musculoskeletal: Negative.   Skin: Negative.   Allergic/Immunologic: Negative.   Neurological: Negative.   Hematological: Negative.   Psychiatric/Behavioral: Negative.    All other systems reviewed and are negative.    Objective:    Physical Exam Vitals reviewed.  Constitutional:      Appearance: Normal appearance.  HENT:     Mouth/Throat:     Mouth: Mucous membranes are moist.  Eyes:     Pupils: Pupils are equal, round, and reactive to light.  Neck:     Vascular: No carotid bruit.  Cardiovascular:     Rate and Rhythm: Normal rate and regular  rhythm.     Pulses: Normal pulses.     Heart sounds: Normal heart sounds.  Pulmonary:     Effort: Pulmonary effort is normal.     Breath sounds: Normal breath sounds.  Abdominal:     General: Bowel sounds are normal.     Palpations: Abdomen is soft. There is no hepatomegaly, splenomegaly or mass.     Tenderness: There is no abdominal tenderness.     Hernia: No hernia is present.  Musculoskeletal:        General: No tenderness.     Cervical back: Neck supple.     Right lower leg: No edema.     Left lower leg: No edema.  Skin:    Findings: No rash.  Neurological:     Mental Status: She is alert and oriented to person, place, and time.     Motor: No weakness.  Psychiatric:        Mood and Affect: Mood and affect normal.        Behavior: Behavior normal.    There were no vitals taken for this visit. Wt Readings from Last 3 Encounters:  03/17/20 127 lb 8 oz (57.8 kg)  12/14/19 138 lb 4.8 oz (62.7 kg)  09/09/19 137 lb 4.8 oz (62.3 kg)     Health Maintenance Due  Topic Date Due   TETANUS/TDAP  Never done   Zoster Vaccines- Shingrix (1 of 2) Never done   DEXA SCAN  Never done   PNA vac Low Risk Adult (2 of 2 - PCV13) 09/10/2015   COVID-19 Vaccine (4 - Booster for Pfizer series) 04/17/2020    There are no preventive care reminders to display for this patient.  Lab Results  Component Value Date   TSH 3.72 12/04/2019   Lab Results  Component Value Date   WBC 5.9 12/04/2019   HGB 13.4 12/04/2019   HCT 40.0 12/04/2019   MCV 93.2 12/04/2019   PLT 177 12/04/2019   Lab Results  Component Value Date   NA 141 12/04/2019   K 4.5 12/04/2019   CO2 24 12/04/2019   GLUCOSE 95 12/04/2019   BUN 17 12/04/2019   CREATININE 0.76 12/04/2019   BILITOT 0.8 12/04/2019   ALKPHOS 87 11/06/2018   AST 23 12/04/2019   ALT 16 12/04/2019   PROT 8.0 12/04/2019   ALBUMIN 4.6 11/06/2018   CALCIUM 9.3 12/04/2019   ANIONGAP 10 11/06/2018   Lab Results  Component Value Date   CHOL 173  11/06/2018   Lab Results  Component Value Date   HDL 70 11/06/2018   Lab Results  Component Value Date   LDLCALC 94 11/06/2018   Lab  Results  Component Value Date   TRIG 43 11/06/2018   Lab Results  Component Value Date   CHOLHDL 2.5 11/06/2018   No results found for: HGBA1C    Assessment & Plan:   Problem List Items Addressed This Visit       Cardiovascular and Mediastinum   Hypertension    Patient blood pressure is normal patient denies any chest pain or shortness of breath there is no history of palpitation or paroxysmal nocturnal dyspnea   patient was advised to follow low-salt low-cholesterol diet    ideally I want to keep systolic blood pressure below 098 mmHg, patient was asked to check blood pressure one times a week and give me a report on that.  Patient will be follow-up in 3 months  or earlier as needed, patient will call me back for any change in the cardiovascular symptoms Patient was advised to buy a book from local bookstore concerning blood pressure and read several chapters  every day.  This will be supplemented by some of the material we will give him from the office.  Patient should also utilize other resources like YouTube and Internet to learn more about the blood pressure and the diet.         Musculoskeletal and Integument   Sprain of right rotator cuff capsule    Patient was suggested rotator cuff exercises         Other   Hyperlipidemia    Mediterranean diet       Vertigo    Patient was advised to use sleep pillow to sleep at night       Other Visit Diagnoses     Suspected COVID-19 virus infection    -  Primary   Relevant Orders   POC COVID-19 (Completed)       No orders of the defined types were placed in this encounter.   Follow-up: No follow-ups on file.    Corky Downs, MD

## 2020-06-21 ENCOUNTER — Other Ambulatory Visit: Payer: Self-pay | Admitting: *Deleted

## 2020-06-21 MED ORDER — AMLODIPINE BESYLATE 5 MG PO TABS: 10.0000 mg | ORAL_TABLET | Freq: Every day | ORAL | 3 refills | Status: DC

## 2020-06-23 ENCOUNTER — Other Ambulatory Visit: Payer: Self-pay | Admitting: *Deleted

## 2020-06-24 ENCOUNTER — Encounter: Payer: Self-pay | Admitting: Family Medicine

## 2020-06-24 ENCOUNTER — Ambulatory Visit (INDEPENDENT_AMBULATORY_CARE_PROVIDER_SITE_OTHER): Payer: Medicare Other | Admitting: Family Medicine

## 2020-06-24 ENCOUNTER — Other Ambulatory Visit: Payer: Self-pay

## 2020-06-24 VITALS — BP 138/78 | HR 96 | Ht 61.5 in | Wt 136.3 lb

## 2020-06-24 DIAGNOSIS — R053 Chronic cough: Secondary | ICD-10-CM

## 2020-06-24 DIAGNOSIS — U099 Post covid-19 condition, unspecified: Secondary | ICD-10-CM | POA: Diagnosis not present

## 2020-06-24 DIAGNOSIS — I10 Essential (primary) hypertension: Secondary | ICD-10-CM

## 2020-06-24 MED ORDER — AMLODIPINE BESYLATE 10 MG PO TABS: 10.0000 mg | ORAL_TABLET | Freq: Every day | ORAL | 3 refills | Status: DC

## 2020-06-24 NOTE — Assessment & Plan Note (Signed)
Cough getting better but sore throat is not improving. Recommended warm salt water gargle.

## 2020-06-24 NOTE — Progress Notes (Signed)
Established Patient Office Visit  SUBJECTIVE:  Subjective  Patient ID: Kathy Morgan, female    DOB: 1934/12/28  Age: 86 y.o. MRN: [id number]  CC:  Chief Complaint  Patient presents with   post covid    HPI Kathy Morgan is a 86 y.o. female presenting today for     Past Medical History:  Diagnosis Date   Arthritis    lower back   Hyperlipidemia    Hypertension    Vertigo    Wears dentures    full upper    Past Surgical History:  Procedure Laterality Date   BREAST SURGERY Right 2011   R cyst removal (benign)    CATARACT EXTRACTION W/PHACO Right 01/05/2019   Procedure: CATARACT EXTRACTION PHACO AND INTRAOCULAR LENS PLACEMENT (IOC) RIGHT 2.88  00:29.7;  Surgeon: Nevada Crane, MD;  Location: [facility];  Service: Ophthalmology;  Laterality: Right;   CATARACT EXTRACTION W/PHACO Left 02/23/2019   Procedure: CATARACT EXTRACTION PHACO AND INTRAOCULAR LENS PLACEMENT (IOC) LEFT 4.68  00:41.3;  Surgeon: Nevada Crane, MD;  Location: [facility];  Service: Ophthalmology;  Laterality: Left;  needs to stay 2nd   VAGINAL HYSTERECTOMY  1991    Family History  Problem Relation Age of Onset   Cancer Mother    Dementia Father     Social History   Socioeconomic History   Marital status: Married    Spouse name: Not on file   Number of children: Not on file   Years of education: Not on file   Highest education level: Not on file  Occupational History   Not on file  Tobacco Use   Smoking status: Never   Smokeless tobacco: Never  Vaping Use   Vaping Use: Never used  Substance and Sexual Activity   Alcohol use: Yes    Alcohol/week: 2.0 standard drinks    Types: 2 Glasses of wine per week   Drug use: No   Sexual activity: Never  Other Topics Concern   Not on file  Social History Narrative   Not on file   Social Determinants of Health   Financial Resource Strain: Not on file  Food Insecurity: Not on file  Transportation Needs: Not on file   Physical Activity: Not on file  Stress: Not on file  Social Connections: Not on file  Intimate Partner Violence: Not on file     Current Outpatient Medications:    amLODipine (NORVASC) 10 MG tablet, Take 1 tablet (10 mg total) by mouth daily., Disp: 90 tablet, Rfl: 3   amLODipine (NORVASC) 5 MG tablet, Take 2 tablets (10 mg total) by mouth daily., Disp: 60 tablet, Rfl: 3   atorvastatin (LIPITOR) 10 MG tablet, Take 1 tablet (10 mg total) by mouth daily., Disp: 90 tablet, Rfl: 3   cholecalciferol (VITAMIN D) 400 UNITS TABS tablet, Take 1,000 Units by mouth., Disp: , Rfl:    cyclobenzaprine (FLEXERIL) 5 MG tablet, Take 1 tablet (5 mg total) by mouth 3 (three) times daily as needed for muscle spasms., Disp: 30 tablet, Rfl: 1   meloxicam (MOBIC) 15 MG tablet, TAKE 1 TABLET (15 MG TOTAL) BY MOUTH DAILY., Disp: 30 tablet, Rfl: 0   metoprolol tartrate (LOPRESSOR) 100 MG tablet, TAKE 1 TABLET DAILY., Disp: 90 tablet, Rfl: 3   No Known Allergies  ROS Review of Systems  HENT:  Positive for sore throat.   Respiratory: Negative.    Cardiovascular: Negative.   Genitourinary: Negative.   Musculoskeletal: Negative.   Psychiatric/Behavioral:  Negative.      OBJECTIVE:    Physical Exam Constitutional:      Appearance: Normal appearance.  HENT:     Head: Atraumatic.  Cardiovascular:     Rate and Rhythm: Normal rate and regular rhythm.  Pulmonary:     Effort: Pulmonary effort is normal.  Musculoskeletal:        General: Normal range of motion.  Psychiatric:        Mood and Affect: Mood normal.    BP 138/78   Pulse 96   Ht 5' 1.5" (1.562 m)   Wt 136 lb 4.8 oz (61.8 kg)   BMI 25.34 kg/m  Wt Readings from Last 3 Encounters:  06/24/20 136 lb 4.8 oz (61.8 kg)  06/16/20 127 lb (57.6 kg)  03/17/20 127 lb 8 oz (57.8 kg)    Health Maintenance Due  Topic Date Due   TETANUS/TDAP  Never done   Zoster Vaccines- Shingrix (1 of 2) Never done   DEXA SCAN  Never done   PNA vac Low Risk Adult  (2 of 2 - PCV13) 09/10/2015   COVID-19 Vaccine (4 - Booster for Pfizer series) 04/17/2020    There are no preventive care reminders to display for this patient.  CBC Latest Ref Rng & Units 12/04/2019 11/06/2018 12/17/2012  WBC 3.8 - 10.8 Thousand/uL 5.9 7.0 11.1(H)  Hemoglobin 11.7 - 15.5 g/dL 88.4 16.6 06.3  Hematocrit 35.0 - 45.0 % 40.0 42.6 39.4  Platelets 140 - 400 Thousand/uL 177 181 165   CMP Latest Ref Rng & Units 12/04/2019 11/06/2018 12/17/2012  Glucose 65 - 99 mg/dL 95 016(W) 98  BUN 7 - 25 mg/dL 17 15 10(X)  Creatinine 0.60 - 0.88 mg/dL 3.23 5.57 3.22  Sodium 135 - 146 mmol/L 141 142 136  Potassium 3.5 - 5.3 mmol/L 4.5 3.8 3.9  Chloride 98 - 110 mmol/L 105 106 102  CO2 20 - 32 mmol/L 24 26 27   Calcium 8.6 - 10.4 mg/dL 9.3 9.4 9.7  Total Protein 6.1 - 8.1 g/dL 8.0 ) -  Total Bilirubin 0.2 - 1.2 mg/dL 0.8 0.2(R) -  Alkaline Phos 38 - 126 U/L - 87 -  AST 10 - 35 U/L 23 26 -  ALT 6 - 29 U/L 16 19 -    Lab Results  Component Value Date   TSH 3.72 12/04/2019   Lab Results  Component Value Date   ALBUMIN 4.6 11/06/2018   ANIONGAP 10 11/06/2018   Lab Results  Component Value Date   CHOL 173 11/06/2018   HDL 70 11/06/2018   LDLCALC 94 11/06/2018   CHOLHDL 2.5 11/06/2018   Lab Results  Component Value Date   TRIG 43 11/06/2018   No results found for: HGBA1C    ASSESSMENT & PLAN:   Problem List Items Addressed This Visit       Cardiovascular and Mediastinum   Hypertension - Primary    Patient with normal BP today, refilled Norvasc 10 mg. No Side efffects.        Relevant Medications   amLODipine (NORVASC) 10 MG tablet     Other   Post-COVID chronic cough    Cough getting better but sore throat is not improving. Recommended warm salt water gargle.         Meds ordered this encounter  Medications   amLODipine (NORVASC) 10 MG tablet    Sig: Take 1 tablet (10 mg total) by mouth daily.    Dispense:  90 tablet  Refill:  3      Follow-up:  No follow-ups on file.    Irish Lack, FNP [facility] [address] [place], [place] [address]

## 2020-06-24 NOTE — Assessment & Plan Note (Signed)
Patient with normal BP today, refilled Norvasc 10 mg. No Side efffects.

## 2020-07-02 ENCOUNTER — Emergency Department: Payer: Medicare Other

## 2020-07-02 ENCOUNTER — Emergency Department
Admission: EM | Admit: 2020-07-02 | Discharge: 2020-07-02 | Disposition: A | Discharge status: Home / Self Care | Payer: Medicare Other | Attending: Emergency Medicine | Admitting: Emergency Medicine

## 2020-07-02 ENCOUNTER — Other Ambulatory Visit: Payer: Self-pay

## 2020-07-02 DIAGNOSIS — G8929 Other chronic pain: Secondary | ICD-10-CM | POA: Diagnosis not present

## 2020-07-02 DIAGNOSIS — I10 Essential (primary) hypertension: Secondary | ICD-10-CM | POA: Insufficient documentation

## 2020-07-02 DIAGNOSIS — M542 Cervicalgia: Secondary | ICD-10-CM | POA: Diagnosis not present

## 2020-07-02 DIAGNOSIS — Z79899 Other long term (current) drug therapy: Secondary | ICD-10-CM | POA: Diagnosis not present

## 2020-07-02 LAB — CBC WITH DIFFERENTIAL/PLATELET
Abs Immature Granulocytes: 0.01 10*3/uL (ref 0.00–0.07)
Basophils Absolute: 0 10*3/uL (ref 0.0–0.1)
Basophils Relative: 0 %
Eosinophils Absolute: 0.1 10*3/uL (ref 0.0–0.5)
Eosinophils Relative: 1 %
HCT: 39.7 % (ref 36.0–46.0)
Hemoglobin: 13.6 g/dL (ref 12.0–15.0)
Immature Granulocytes: 0 %
Lymphocytes Relative: 40 %
Lymphs Abs: 2.1 10*3/uL (ref 0.7–4.0)
MCH: 31.3 pg (ref 26.0–34.0)
MCHC: 34.3 g/dL (ref 30.0–36.0)
MCV: 91.5 fL (ref 80.0–100.0)
Monocytes Absolute: 0.5 10*3/uL (ref 0.1–1.0)
Monocytes Relative: 10 %
Neutro Abs: 2.6 10*3/uL (ref 1.7–7.7)
Neutrophils Relative %: 49 %
Platelets: 191 10*3/uL (ref 150–400)
RBC: 4.34 MIL/uL (ref 3.87–5.11)
RDW: 13.6 % (ref 11.5–15.5)
WBC: 5.3 10*3/uL (ref 4.0–10.5)
nRBC: 0 % (ref 0.0–0.2)

## 2020-07-02 LAB — COMPREHENSIVE METABOLIC PANEL
ALT: 19 U/L (ref 0–44)
AST: 27 U/L (ref 15–41)
Albumin: 4.5 g/dL (ref 3.5–5.0)
Alkaline Phosphatase: 72 U/L (ref 38–126)
Anion gap: 5 (ref 5–15)
BUN: 14 mg/dL (ref 8–23)
CO2: 27 mmol/L (ref 22–32)
Calcium: 9.2 mg/dL (ref 8.9–10.3)
Chloride: 107 mmol/L (ref 98–111)
Creatinine, Ser: 0.75 mg/dL (ref 0.44–1.00)
GFR, Estimated: >60 mL/min (ref >60)
Glucose, Bld: 102 mg/dL — ABNORMAL HIGH (ref 70–99)
Potassium: 3.5 mmol/L (ref 3.5–5.1)
Sodium: 139 mmol/L (ref 135–145)
Total Bilirubin: 1.2 mg/dL (ref 0.3–1.2)
Total Protein: 8.5 g/dL — ABNORMAL HIGH (ref 6.5–8.1)

## 2020-07-02 LAB — SEDIMENTATION RATE: Sed Rate: 25 mm/hr (ref 0–30)

## 2020-07-02 NOTE — ED Triage Notes (Signed)
Pt to ED POV for stiff/sore neck radiating to ears and jaw for 2 months after receiving COVID vaccine.  Pt in NAD, ambulatory, clear speech

## 2020-07-02 NOTE — Discharge Instructions (Addendum)
Follow-up with your regular doctor.  Please keep your regular appointments.  Use a wet warm compress to loosen the muscles of your neck.  Tylenol for pain as needed.  Return emergency department if worse

## 2020-07-02 NOTE — ED Provider Notes (Signed)
[facility] Emergency Department Provider Note  ____________________________________________   Event Date/Time   First MD Initiated Contact with Patient 07/02/20 1644     (approximate)  I have reviewed the triage vital signs and the nursing notes.   HISTORY  Chief Complaint Torticollis    HPI Kathy Morgan is a 86 y.o. female presents emergency department complaining of neck pain for 4 to 5 months.  Patient states that it started after having a COVID-vaccine.  States that she did have to quarantine for possible COVID but is out of quarantine at this time.  Patient states pain is strictly with movement and it comes and goes.  Some days should be fine and some days it hurts more.  States when she lays down it is harder for her to raise her neck to sit up.  She denies fever or chills.  No known injury.  No swelling.  No chest pain or shortness of breath.  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   Neck muscle spasm 02/26/2020   Sprain of right rotator cuff capsule 09/09/2019   Vertigo 09/09/2019   Hypertension    Hyperlipidemia     Past Surgical History:  Procedure Laterality Date   BREAST SURGERY Right 2011   R cyst removal (benign)    CATARACT EXTRACTION W/PHACO Right 01/05/2019   Procedure: CATARACT EXTRACTION PHACO AND INTRAOCULAR LENS PLACEMENT (IOC) RIGHT 2.88  00:29.7;  Surgeon: Nevada Crane, MD;  Location: [facility];  Service: Ophthalmology;  Laterality: Right;   CATARACT EXTRACTION W/PHACO Left 02/23/2019   Procedure: CATARACT EXTRACTION PHACO AND INTRAOCULAR LENS PLACEMENT (IOC) LEFT 4.68  00:41.3;  Surgeon: Nevada Crane, MD;  Location: [facility];  Service: Ophthalmology;  Laterality: Left;  needs to stay 2nd    VAGINAL HYSTERECTOMY  1991    Prior to Admission medications   Medication Sig Start Date End Date Taking? Authorizing Provider  amLODipine (NORVASC) 10 MG tablet Take 1 tablet (10 mg total) by mouth daily. 06/24/20   Irish Lack, FNP  amLODipine (NORVASC) 5 MG tablet Take 2 tablets (10 mg total) by mouth daily. 06/21/20   Corky Downs, MD  atorvastatin (LIPITOR) 10 MG tablet Take 1 tablet (10 mg total) by mouth daily. 04/22/20   Corky Downs, MD  cholecalciferol (VITAMIN D) 400 UNITS TABS tablet Take 1,000 Units by mouth.    [provider]  cyclobenzaprine (FLEXERIL) 5 MG tablet Take 1 tablet (5 mg total) by mouth 3 (three) times daily as needed for muscle spasms. 02/26/20   Irish Lack, FNP  meloxicam (MOBIC) 15 MG tablet TAKE 1 TABLET (15 MG TOTAL) BY MOUTH DAILY. 03/21/20   Corky Downs, MD  metoprolol tartrate (LOPRESSOR) 100 MG tablet TAKE 1 TABLET DAILY. 04/22/20   Corky Downs, MD    Allergies Patient has no known allergies.  Family History  Problem Relation Age of Onset   Cancer Mother    Dementia Father     Social History Social History   Tobacco Use   Smoking status: Never   Smokeless tobacco: Never  Vaping Use   Vaping Use: Never used  Substance Use Topics   Alcohol use: Yes    Alcohol/week: 2.0 standard drinks  Types: 2 Glasses of wine per week   Drug use: No    Review of Systems  Constitutional: No fever/chills Eyes: No visual changes. ENT: No sore throat. Respiratory: Denies cough Cardiovascular: Denies chest pain Gastrointestinal: Denies abdominal pain Genitourinary: Negative for dysuria. Musculoskeletal: Negative for back pain.  Positive for neck pain Skin: Negative for rash. Psychiatric: no mood changes,     ____________________________________________   PHYSICAL EXAM:  VITAL SIGNS: ED Triage Vitals  Enc Vitals Group     BP 07/02/20 1434 (!) 156/68     Pulse Rate 07/02/20 1434 91     Resp 07/02/20 1434 18     Temp  07/02/20 1434 98.4 F (36.9 C)     Temp Source 07/02/20 1434 Oral     SpO2 07/02/20 1434 99 %     Weight 07/02/20 1435 136 lb (61.7 kg)     Height 07/02/20 1435 5\' 1"  (1.549 m)     Head Circumference --      Peak Flow --      Pain Score 07/02/20 1434 6     Pain Loc --      Pain Edu? --      Excl. in GC? --     Constitutional: Alert and oriented. Well appearing and in no acute distress. Eyes: Conjunctivae are normal.  Head: Atraumatic. Nose: No congestion/rhinnorhea. Mouth/Throat: Mucous membranes are moist.   Neck:  supple no lymphadenopathy noted Cardiovascular: Normal rate, regular rhythm. Heart sounds are normal Respiratory: Normal respiratory effort.  No retractions, lungs c t a  GU: deferred Musculoskeletal: FROM all extremities, warm and well perfused, C-spine is minimally tender, musculature is tender along the trapezius and supraspinatus, grips are equal bilaterally, neurovascular is intact Neurologic:  Normal speech and language.  Skin:  Skin is warm, dry and intact. No rash noted. Psychiatric: Mood and affect are normal. Speech and behavior are normal.  ____________________________________________   LABS (all labs ordered are listed, but only abnormal results are displayed)  Labs Reviewed  COMPREHENSIVE METABOLIC PANEL - Abnormal; Notable for the following components:      Result Value   Glucose, Bld 102 (*)    Total Protein 8.5 (*)    All other components within normal limits  CBC WITH DIFFERENTIAL/PLATELET  SEDIMENTATION RATE   ____________________________________________   ____________________________________________  RADIOLOGY  X-rays C-spine  ____________________________________________   PROCEDURES  Procedure(s) performed: No  Procedures    ____________________________________________   INITIAL IMPRESSION / ASSESSMENT AND PLAN / ED COURSE  Pertinent labs & imaging results that were available during my care of the patient were reviewed  by me and considered in my medical decision making (see chart for details).   The patient is an 86 year old female presents with neck pain.  See HPI.  Physical exam shows patient per stable.  This is most likely a musculoskeletal problem.  Patient is tender along C-spine and musculature.  Pain is reproduced with movement of the neck.  No meningeal irritation noted  DDx: Neck pain, arthritis, fracture of the neck, muscle spasm  Labs ordered by another provider from triage of CBC and metabolic panel are both normal Sed rate ordered X-ray of the C-spine  X-ray of the C-spine shows arthritis changes only.  This was reviewed by me and confirmed by radiology  I do feel this is a chronic problem.  Is been ongoing for more than 4 months.  Do not feel there is anything acute.  She is to follow-up with her regular doctor.  Use a wet warm compress to loosen muscles.  Take Tylenol for pain as needed.  States she understands.  She was discharged stable condition  Kathy Morgan was evaluated in Emergency Department on 07/02/2020 for the symptoms described in the history of present illness. She was evaluated in the context of the global COVID-19 pandemic, which necessitated consideration that the patient might be at risk for infection with the SARS-CoV-2 virus that causes COVID-19. Institutional protocols and algorithms that pertain to the evaluation of patients at risk for COVID-19 are in a state of rapid change based on information released by regulatory bodies including the CDC and federal and state organizations. These policies and algorithms were followed during the patient's care in the ED.    As part of my medical decision making, I reviewed the following data within the electronic MEDICAL RECORD NUMBER Nursing notes reviewed and incorporated, Labs reviewed , Old chart reviewed, Radiograph reviewed , Notes from prior ED visits, and [place] Controlled Substance  Database  ____________________________________________   FINAL CLINICAL IMPRESSION(S) / ED DIAGNOSES  Final diagnoses:  Chronic neck pain      NEW MEDICATIONS STARTED DURING THIS VISIT:  New Prescriptions   No medications on file     Note:  This document was prepared using Dragon voice recognition software and may include unintentional dictation errors.    Faythe Ghee, PA-C 07/02/20 1823    Minna Antis, MD 07/02/20 [phone]

## 2020-07-12 ENCOUNTER — Encounter: Payer: Self-pay | Admitting: Internal Medicine

## 2020-07-12 ENCOUNTER — Ambulatory Visit (INDEPENDENT_AMBULATORY_CARE_PROVIDER_SITE_OTHER): Payer: Medicare Other | Admitting: Internal Medicine

## 2020-07-12 ENCOUNTER — Other Ambulatory Visit: Payer: Self-pay

## 2020-07-12 VITALS — BP 143/71 | HR 72 | Ht 61.0 in | Wt 137.2 lb

## 2020-07-12 DIAGNOSIS — M2011 Hallux valgus (acquired), right foot: Secondary | ICD-10-CM | POA: Diagnosis not present

## 2020-07-12 DIAGNOSIS — E7800 Pure hypercholesterolemia, unspecified: Secondary | ICD-10-CM | POA: Diagnosis not present

## 2020-07-12 DIAGNOSIS — M62838 Other muscle spasm: Secondary | ICD-10-CM

## 2020-07-12 DIAGNOSIS — I10 Essential (primary) hypertension: Secondary | ICD-10-CM | POA: Diagnosis not present

## 2020-07-12 MED ORDER — AMLODIPINE BESYLATE 5 MG PO TABS: 5.0000 mg | ORAL_TABLET | Freq: Every day | ORAL | 3 refills | Status: DC

## 2020-07-12 NOTE — Assessment & Plan Note (Signed)
Hypercholesterolemia  I advised the patient to follow Mediterranean diet This diet is rich in fruits vegetables and whole grain, and This diet is also rich in fish and lean meat Patient should also eat a handful of almonds or walnuts daily Recent heart study indicated that average follow-up on this kind of diet reduces the cardiovascular mortality by 50 to 70%== 

## 2020-07-12 NOTE — Assessment & Plan Note (Signed)
Refer to podiatry

## 2020-07-12 NOTE — Assessment & Plan Note (Signed)
Neck muscle spasm is improving

## 2020-07-12 NOTE — Assessment & Plan Note (Signed)

## 2020-07-12 NOTE — Progress Notes (Signed)
Established Patient Office Visit  Subjective:  Patient ID: Kathy Morgan, female    DOB: [birth year]  Age: 86 y.o. MRN: [id number]  CC:  Chief Complaint  Patient presents with   Follow-up    HPI  Kathy Morgan presents for fallow up from ER/ c/o sweling of feet trouble walking occasionally  Past Medical History:  Diagnosis Date   Arthritis    lower back   Hyperlipidemia    Hypertension    Vertigo    Wears dentures    full upper    Past Surgical History:  Procedure Laterality Date   BREAST SURGERY Right 2011   R cyst removal (benign)    CATARACT EXTRACTION W/PHACO Right 01/05/2019   Procedure: CATARACT EXTRACTION PHACO AND INTRAOCULAR LENS PLACEMENT (IOC) RIGHT 2.88  00:29.7;  Surgeon: Nevada Crane, MD;  Location: [facility];  Service: Ophthalmology;  Laterality: Right;   CATARACT EXTRACTION W/PHACO Left 02/23/2019   Procedure: CATARACT EXTRACTION PHACO AND INTRAOCULAR LENS PLACEMENT (IOC) LEFT 4.68  00:41.3;  Surgeon: Nevada Crane, MD;  Location: [facility];  Service: Ophthalmology;  Laterality: Left;  needs to stay 2nd   VAGINAL HYSTERECTOMY  1991    Family History  Problem Relation Age of Onset   Cancer Mother    Dementia Father     Social History   Socioeconomic History   Marital status: Married    Spouse name: Not on file   Number of children: Not on file   Years of education: Not on file   Highest education level: Not on file  Occupational History   Not on file  Tobacco Use   Smoking status: Never   Smokeless tobacco: Never  Vaping Use   Vaping Use: Never used  Substance and Sexual Activity   Alcohol use: Yes    Alcohol/week: 2.0 standard drinks    Types: 2 Glasses of wine per week   Drug use: No   Sexual activity: Never  Other Topics Concern   Not on file  Social History Narrative   Not on file   Social Determinants of Health   Financial Resource Strain: Not on file  Food Insecurity: Not on file   Transportation Needs: Not on file  Physical Activity: Not on file  Stress: Not on file  Social Connections: Not on file  Intimate Partner Violence: Not on file     Current Outpatient Medications:    amLODipine (NORVASC) 5 MG tablet, Take 1 tablet (5 mg total) by mouth daily., Disp: 90 tablet, Rfl: 3   atorvastatin (LIPITOR) 10 MG tablet, Take 1 tablet (10 mg total) by mouth daily., Disp: 90 tablet, Rfl: 3   cholecalciferol (VITAMIN D) 400 UNITS TABS tablet, Take 1,000 Units by mouth., Disp: , Rfl:    metoprolol tartrate (LOPRESSOR) 100 MG tablet, TAKE 1 TABLET DAILY., Disp: 90 tablet, Rfl: 3   No Known Allergies  ROS Review of Systems  Constitutional: Negative.   HENT: Negative.    Eyes: Negative.   Respiratory: Negative.    Cardiovascular: Negative.   Gastrointestinal: Negative.   Endocrine: Negative.   Genitourinary: Negative.  Negative for difficulty urinating, dysuria and urgency.  Musculoskeletal:  Positive for arthralgias, myalgias, neck pain and neck stiffness. Negative for back pain, gait problem and joint swelling.       Hallux valgus  deformity  Skin: Negative.   Allergic/Immunologic: Negative.   Neurological: Negative.   Hematological: Negative.   Psychiatric/Behavioral: Negative.    All other systems reviewed  and are negative.    Objective:    Physical Exam Vitals reviewed.  Constitutional:      Appearance: Normal appearance.  HENT:     Mouth/Throat:     Mouth: Mucous membranes are moist.  Eyes:     Pupils: Pupils are equal, round, and reactive to light.  Neck:     Vascular: No carotid bruit.  Cardiovascular:     Rate and Rhythm: Normal rate and regular rhythm.     Pulses: Normal pulses.     Heart sounds: Normal heart sounds.  Pulmonary:     Effort: Pulmonary effort is normal.     Breath sounds: Normal breath sounds.  Abdominal:     General: Bowel sounds are normal.     Palpations: Abdomen is soft. There is no hepatomegaly, splenomegaly or mass.      Tenderness: There is no abdominal tenderness.     Hernia: No hernia is present.  Musculoskeletal:        General: No tenderness.     Cervical back: Neck supple.     Right lower leg: No edema.     Left lower leg: No edema.     Comments: Bunion rt big toe  Skin:    Findings: No rash.  Neurological:     Mental Status: She is alert and oriented to person, place, and time.     Motor: No weakness.  Psychiatric:        Mood and Affect: Mood and affect normal.        Behavior: Behavior normal.    There were no vitals taken for this visit. Wt Readings from Last 3 Encounters:  07/02/20 136 lb (61.7 kg)  06/24/20 136 lb 4.8 oz (61.8 kg)  06/16/20 127 lb (57.6 kg)     Health Maintenance Due  Topic Date Due   TETANUS/TDAP  Never done   Zoster Vaccines- Shingrix (1 of 2) Never done   DEXA SCAN  Never done   PNA vac Low Risk Adult (2 of 2 - PCV13) 09/10/2015   COVID-19 Vaccine (4 - Booster for Pfizer series) 04/17/2020    There are no preventive care reminders to display for this patient.  Lab Results  Component Value Date   TSH 3.72 12/04/2019   Lab Results  Component Value Date   WBC 5.3 07/02/2020   HGB 13.6 07/02/2020   HCT 39.7 07/02/2020   MCV 91.5 07/02/2020   PLT 191 07/02/2020   Lab Results  Component Value Date   NA 139 07/02/2020   K 3.5 07/02/2020   CO2 27 07/02/2020   GLUCOSE 102 (H) 07/02/2020   BUN 14 07/02/2020   CREATININE 0.75 07/02/2020   BILITOT 1.2 07/02/2020   ALKPHOS 72 07/02/2020   AST 27 07/02/2020   ALT 19 07/02/2020   PROT 8.5 (H) 07/02/2020   ALBUMIN 4.5 07/02/2020   CALCIUM 9.2 07/02/2020   ANIONGAP 5 07/02/2020   Lab Results  Component Value Date   CHOL 173 11/06/2018   Lab Results  Component Value Date   HDL 70 11/06/2018   Lab Results  Component Value Date   LDLCALC 94 11/06/2018   Lab Results  Component Value Date   TRIG 43 11/06/2018   Lab Results  Component Value Date   CHOLHDL 2.5 11/06/2018   No results  found for: HGBA1C    Assessment & Plan:   Problem List Items Addressed This Visit       Cardiovascular and Mediastinum   Hypertension -  Primary     Patient denies any chest pain or shortness of breath there is no history of palpitation or paroxysmal nocturnal dyspnea   patient was advised to follow low-salt low-cholesterol diet    ideally I want to keep systolic blood pressure below 427 mmHg, patient was asked to check blood pressure one times a week and give me a report on that.  Patient will be follow-up in 3 months  or earlier as needed, patient will call me back for any change in the cardiovascular symptoms Patient was advised to buy a book from local bookstore concerning blood pressure and read several chapters  every day.  This will be supplemented by some of the material we will give him from the office.  Patient should also utilize other resources like YouTube and Internet to learn more about the blood pressure and the diet.       Relevant Medications   amLODipine (NORVASC) 5 MG tablet     Musculoskeletal and Integument   Neck muscle spasm    Neck muscle spasm is improving       Hallux valgus of right foot    Refer to podiatry         Other   Hyperlipidemia    Hypercholesterolemia  I advised the patient to follow Mediterranean diet This diet is rich in fruits vegetables and whole grain, and This diet is also rich in fish and lean meat Patient should also eat a handful of almonds or walnuts daily Recent heart study indicated that average follow-up on this kind of diet reduces the cardiovascular mortality by 50 to 70%==       Relevant Medications   amLODipine (NORVASC) 5 MG tablet    Meds ordered this encounter  Medications   amLODipine (NORVASC) 5 MG tablet    Sig: Take 1 tablet (5 mg total) by mouth daily.    Dispense:  90 tablet    Refill:  3    Follow-up: No follow-ups on file.    Corky Downs, MD

## 2020-07-13 NOTE — Addendum Note (Signed)
Addended by: Jobie Quaker on: 07/13/2020 12:39 PM   Modules accepted: Orders

## 2020-07-26 ENCOUNTER — Other Ambulatory Visit: Payer: Self-pay

## 2020-07-26 ENCOUNTER — Encounter: Payer: Self-pay | Admitting: Internal Medicine

## 2020-07-26 ENCOUNTER — Ambulatory Visit (INDEPENDENT_AMBULATORY_CARE_PROVIDER_SITE_OTHER): Payer: Medicare Other | Admitting: Internal Medicine

## 2020-07-26 DIAGNOSIS — E7800 Pure hypercholesterolemia, unspecified: Secondary | ICD-10-CM

## 2020-07-26 DIAGNOSIS — I10 Essential (primary) hypertension: Secondary | ICD-10-CM | POA: Diagnosis not present

## 2020-07-26 DIAGNOSIS — M62838 Other muscle spasm: Secondary | ICD-10-CM | POA: Diagnosis not present

## 2020-07-26 DIAGNOSIS — M2011 Hallux valgus (acquired), right foot: Secondary | ICD-10-CM

## 2020-07-26 NOTE — Assessment & Plan Note (Signed)
Refer to foot doctor. 

## 2020-07-26 NOTE — Progress Notes (Signed)
Established Patient Office Visit  Subjective:  Patient ID: Kathy Morgan, female    DOB: 09-06-1934  Age: 86 y.o. MRN: [id number]  CC:  Chief Complaint  Patient presents with   Follow-up    Post covid 2 week follow up   Hypertension    URI  Associated symptoms include neck pain. Pertinent negatives include no chest pain or headaches.  Hypertension Associated symptoms include neck pain. Pertinent negatives include no chest pain or headaches.  Neck Pain  This is a chronic problem. The current episode started more than 1 year ago. The problem has been waxing and waning. The quality of the pain is described as cramping. The pain is at a severity of 6/10. The pain is moderate. Nothing aggravates the symptoms. Pertinent negatives include no chest pain, headaches, pain with swallowing, syncope, visual change or weight loss. She has tried acetaminophen for the symptoms.  She is known to have problem also has a high cholesterol hypertension with hypertensive cardiovascular disease.  She is fairly active and can walk around 1 or 2 blocks without any problem, don't smoke or drink  Past Medical History:  Diagnosis Date   Arthritis    lower back   Hyperlipidemia    Hypertension    Vertigo    Wears dentures    full upper    Past Surgical History:  Procedure Laterality Date   BREAST SURGERY Right 2011   R cyst removal (benign)    CATARACT EXTRACTION W/PHACO Right 01/05/2019   Procedure: CATARACT EXTRACTION PHACO AND INTRAOCULAR LENS PLACEMENT (IOC) RIGHT 2.88  00:29.7;  Surgeon: Nevada Crane, MD;  Location: [facility];  Service: Ophthalmology;  Laterality: Right;   CATARACT EXTRACTION W/PHACO Left 02/23/2019   Procedure: CATARACT EXTRACTION PHACO AND INTRAOCULAR LENS PLACEMENT (IOC) LEFT 4.68  00:41.3;  Surgeon: Nevada Crane, MD;  Location: [facility];  Service: Ophthalmology;  Laterality: Left;  needs to stay 2nd   VAGINAL HYSTERECTOMY  1991    Family History   Problem Relation Age of Onset   Cancer Mother    Dementia Father     Social History   Socioeconomic History   Marital status: Married    Spouse name: Not on file   Number of children: Not on file   Years of education: Not on file   Highest education level: Not on file  Occupational History   Not on file  Tobacco Use   Smoking status: Never   Smokeless tobacco: Never  Vaping Use   Vaping Use: Never used  Substance and Sexual Activity   Alcohol use: Yes    Alcohol/week: 2.0 standard drinks    Types: 2 Glasses of wine per week   Drug use: No   Sexual activity: Never  Other Topics Concern   Not on file  Social History Narrative   Not on file   Social Determinants of Health   Financial Resource Strain: Not on file  Food Insecurity: Not on file  Transportation Needs: Not on file  Physical Activity: Not on file  Stress: Not on file  Social Connections: Not on file  Intimate Partner Violence: Not on file     Current Outpatient Medications:    amLODipine (NORVASC) 5 MG tablet, Take 1 tablet (5 mg total) by mouth daily., Disp: 90 tablet, Rfl: 3   atorvastatin (LIPITOR) 10 MG tablet, Take 1 tablet (10 mg total) by mouth daily., Disp: 90 tablet, Rfl: 3   cholecalciferol (VITAMIN D) 400 UNITS TABS  tablet, Take 1,000 Units by mouth., Disp: , Rfl:    metoprolol tartrate (LOPRESSOR) 100 MG tablet, TAKE 1 TABLET DAILY., Disp: 90 tablet, Rfl: 3   No Known Allergies  ROS Review of Systems  Constitutional: Negative.  Negative for weight loss.  HENT: Negative.    Eyes: Negative.   Respiratory: Negative.    Cardiovascular: Negative.  Negative for chest pain and syncope.  Gastrointestinal: Negative.   Endocrine: Negative.   Genitourinary: Negative.   Musculoskeletal:  Positive for neck pain.  Skin: Negative.   Allergic/Immunologic: Negative.   Neurological: Negative.  Negative for headaches.  Hematological: Negative.   Psychiatric/Behavioral: Negative.    All other  systems reviewed and are negative.    Objective:    Physical Exam Vitals reviewed.  Constitutional:      Appearance: Normal appearance.  HENT:     Mouth/Throat:     Mouth: Mucous membranes are moist.  Eyes:     Pupils: Pupils are equal, round, and reactive to light.  Neck:     Vascular: No carotid bruit.  Cardiovascular:     Rate and Rhythm: Normal rate and regular rhythm.     Pulses: Normal pulses.     Heart sounds: Normal heart sounds.  Pulmonary:     Effort: Pulmonary effort is normal.     Breath sounds: Normal breath sounds.  Abdominal:     General: Bowel sounds are normal.     Palpations: Abdomen is soft. There is no hepatomegaly, splenomegaly or mass.     Tenderness: There is no abdominal tenderness.     Hernia: No hernia is present.  Musculoskeletal:        General: No tenderness.     Cervical back: Neck supple.     Right lower leg: No edema.     Left lower leg: No edema.  Skin:    Findings: No rash.  Neurological:     Mental Status: She is alert and oriented to person, place, and time.     Motor: No weakness.  Psychiatric:        Mood and Affect: Mood and affect normal.        Behavior: Behavior normal.    BP 116/64   Pulse 73   Wt 138 lb 4.8 oz (62.7 kg)   BMI 26.13 kg/m  Wt Readings from Last 3 Encounters:  07/26/20 138 lb 4.8 oz (62.7 kg)  07/12/20 137 lb 3.2 oz (62.2 kg)  07/02/20 136 lb (61.7 kg)     Health Maintenance Due  Topic Date Due   TETANUS/TDAP  Never done   DEXA SCAN  Never done   Zoster Vaccines- Shingrix (2 of 2) 07/30/2013   PNA vac Low Risk Adult (2 of 2 - PCV13) 09/10/2015   COVID-19 Vaccine (4 - Booster for Pfizer series) 04/17/2020    There are no preventive care reminders to display for this patient.  Lab Results  Component Value Date   TSH 3.72 12/04/2019   Lab Results  Component Value Date   WBC 5.3 07/02/2020   HGB 13.6 07/02/2020   HCT 39.7 07/02/2020   MCV 91.5 07/02/2020   PLT 191 07/02/2020   Lab  Results  Component Value Date   NA 139 07/02/2020   K 3.5 07/02/2020   CO2 27 07/02/2020   GLUCOSE 102 (H) 07/02/2020   BUN 14 07/02/2020   CREATININE 0.75 07/02/2020   BILITOT 1.2 07/02/2020   ALKPHOS 72 07/02/2020   AST 27 07/02/2020  ALT 19 07/02/2020   PROT 8.5 (H) 07/02/2020   ALBUMIN 4.5 07/02/2020   CALCIUM 9.2 07/02/2020   ANIONGAP 5 07/02/2020   Lab Results  Component Value Date   CHOL 173 11/06/2018   Lab Results  Component Value Date   HDL 70 11/06/2018   Lab Results  Component Value Date   LDLCALC 94 11/06/2018   Lab Results  Component Value Date   TRIG 43 11/06/2018   Lab Results  Component Value Date   CHOLHDL 2.5 11/06/2018   No results found for: HGBA1C    Assessment & Plan:   Problem List Items Addressed This Visit       Cardiovascular and Mediastinum   Hypertension     Patient denies any chest pain or shortness of breath there is no history of palpitation or paroxysmal nocturnal dyspnea   patient was advised to follow low-salt low-cholesterol diet    ideally I want to keep systolic blood pressure below 488 mmHg, patient was asked to check blood pressure one times a week and give me a report on that.  Patient will be follow-up in 3 months  or earlier as needed, patient will call me back for any change in the cardiovascular symptoms Patient was advised to buy a book from local bookstore concerning blood pressure and read several chapters  every day.  This will be supplemented by some of the material we will give him from the office.  Patient should also utilize other resources like YouTube and Internet to learn more about the blood pressure and the diet.         Musculoskeletal and Integument   Neck muscle spasm    Advised to use neck brace take vitamin D on a regular basis.  She can also use Voltaren gel as directed.       Hallux valgus of right foot    Refer to foot doctor         Other   Hyperlipidemia    Suggest  low-cholesterol diet       No orders of the defined types were placed in this encounter.   Follow-up: No follow-ups on file.    Corky Downs, MD

## 2020-07-26 NOTE — Assessment & Plan Note (Signed)
Suggest low-cholesterol diet

## 2020-07-26 NOTE — Assessment & Plan Note (Signed)
Advised to use neck brace take vitamin D on a regular basis.  She can also use Voltaren gel as directed.

## 2020-07-26 NOTE — Assessment & Plan Note (Signed)

## 2020-07-28 ENCOUNTER — Ambulatory Visit (INDEPENDENT_AMBULATORY_CARE_PROVIDER_SITE_OTHER): Payer: Medicare Other

## 2020-07-28 ENCOUNTER — Ambulatory Visit (INDEPENDENT_AMBULATORY_CARE_PROVIDER_SITE_OTHER): Payer: Medicare Other | Admitting: Podiatry

## 2020-07-28 ENCOUNTER — Other Ambulatory Visit: Payer: Self-pay

## 2020-07-28 DIAGNOSIS — M19071 Primary osteoarthritis, right ankle and foot: Secondary | ICD-10-CM

## 2020-07-28 DIAGNOSIS — M19072 Primary osteoarthritis, left ankle and foot: Secondary | ICD-10-CM

## 2020-07-28 DIAGNOSIS — M19079 Primary osteoarthritis, unspecified ankle and foot: Secondary | ICD-10-CM

## 2020-07-28 NOTE — Progress Notes (Signed)
  Subjective:  Patient ID: Kathy Morgan, female    DOB: 04-16-1934,  MRN: [id number]  Chief Complaint  Patient presents with   Foot Pain    Left foot pain     86 y.o. female presents with the above complaint.  Patient presents with complaint left dorsal midfoot pain.  Patient states been going on for quite some time has progressive gotten worse over the last 2 weeks.  She would like to discuss treatment options for it.  She was worried that she might of broken her foot.  She denies any trauma to the area hurts with getting out of the bed and then gets better throughout the day.  She denies any other acute complaint she has not seen anyone else prior to seeing me.   Review of Systems: Negative except as noted in the HPI. Denies N/V/F/Ch.  Past Medical History:  Diagnosis Date   Arthritis    lower back   Hyperlipidemia    Hypertension    Vertigo    Wears dentures    full upper    Current Outpatient Medications:    amLODipine (NORVASC) 5 MG tablet, Take 1 tablet (5 mg total) by mouth daily., Disp: 90 tablet, Rfl: 3   atorvastatin (LIPITOR) 10 MG tablet, Take 1 tablet (10 mg total) by mouth daily., Disp: 90 tablet, Rfl: 3   cholecalciferol (VITAMIN D) 400 UNITS TABS tablet, Take 1,000 Units by mouth., Disp: , Rfl:    metoprolol tartrate (LOPRESSOR) 100 MG tablet, TAKE 1 TABLET DAILY., Disp: 90 tablet, Rfl: 3  Social History   Tobacco Use  Smoking Status Never  Smokeless Tobacco Never    No Known Allergies Objective:  There were no vitals filed for this visit. There is no height or weight on file to calculate BMI. Constitutional Well developed. Well nourished.  Vascular Dorsalis pedis pulses palpable bilaterally. Posterior tibial pulses palpable bilaterally. Capillary refill normal to all digits.  No cyanosis or clubbing noted. Pedal hair growth normal.  Neurologic Normal speech. Oriented to person, place, and time. Epicritic sensation to light touch grossly present  bilaterally.  Dermatologic Nails well groomed and normal in appearance. No open wounds. No skin lesions.  Orthopedic: Pain on palpation to the left dorsal midfoot.  Pain at the third fourth and fifth tarsometatarsal joint.  Pain with range of motion of the joints.  No pain at the metatarsophalangeal joints.  No extensor or flexor tendinitis noted.   Radiographs: 3 views of skeletally mature adult left foot: Osteoarthritic changes noted of the left first metatarsophalangeal joint severe in nature.  Severe osteoarthritic changes noted of the midfoot joints as well.  Flatfoot foot structure noted.  No other bony abnormalities identified Assessment:   1. Arthritis of midfoot    Plan:  Patient was evaluated and treated and all questions answered.  Left dorsal midfoot arthritis -I explained the patient the etiology of arthritis and various treatment options were discussed.  Given the amount of pain she is having I believe she will benefit from a steroid injection to help decrease acute inflammatory component associated pain.  Patient agrees with plan like to proceed with a steroid injection. -A steroid injection was performed at left dorsal midfoot at point of maximal tenderness using 1% plain Lidocaine and 10 mg of Kenalog. This was well tolerated. -Also encouraged her to utilize Voltaren gel over-the-counter.  She states understanding will do so  No follow-ups on file.

## 2020-08-24 ENCOUNTER — Ambulatory Visit: Payer: Medicare Other | Admitting: Internal Medicine

## 2020-08-30 ENCOUNTER — Other Ambulatory Visit: Payer: Self-pay

## 2020-08-30 ENCOUNTER — Ambulatory Visit (INDEPENDENT_AMBULATORY_CARE_PROVIDER_SITE_OTHER): Payer: Medicare Other | Admitting: Podiatry

## 2020-08-30 DIAGNOSIS — M19079 Primary osteoarthritis, unspecified ankle and foot: Secondary | ICD-10-CM

## 2020-08-30 NOTE — Progress Notes (Signed)
  Subjective:  Patient ID: Kathy Morgan, female    DOB: August 13, 1934,  MRN: [id number]  Chief Complaint  Patient presents with   Arthritis    PT stated that the injection helped a lot but she still has some pain here and there     86 y.o. female presents with the above complaint.  Patient presents with left dorsal midfoot arthritis pain.  She states injection considerably.  She does not have any further pain.  She denies any other acute complaints.  She states the injection helped considerably   Review of Systems: Negative except as noted in the HPI. Denies N/V/F/Ch.  Past Medical History:  Diagnosis Date   Arthritis    lower back   Hyperlipidemia    Hypertension    Vertigo    Wears dentures    full upper    Current Outpatient Medications:    amLODipine (NORVASC) 5 MG tablet, Take 1 tablet (5 mg total) by mouth daily., Disp: 90 tablet, Rfl: 3   atorvastatin (LIPITOR) 10 MG tablet, Take 1 tablet (10 mg total) by mouth daily., Disp: 90 tablet, Rfl: 3   cholecalciferol (VITAMIN D) 400 UNITS TABS tablet, Take 1,000 Units by mouth., Disp: , Rfl:    metoprolol tartrate (LOPRESSOR) 100 MG tablet, TAKE 1 TABLET DAILY., Disp: 90 tablet, Rfl: 3  Social History   Tobacco Use  Smoking Status Never  Smokeless Tobacco Never    No Known Allergies Objective:  There were no vitals filed for this visit. There is no height or weight on file to calculate BMI. Constitutional Well developed. Well nourished.  Vascular Dorsalis pedis pulses palpable bilaterally. Posterior tibial pulses palpable bilaterally. Capillary refill normal to all digits.  No cyanosis or clubbing noted. Pedal hair growth normal.  Neurologic Normal speech. Oriented to person, place, and time. Epicritic sensation to light touch grossly present bilaterally.  Dermatologic Nails well groomed and normal in appearance. No open wounds. No skin lesions.  Orthopedic: No further pain on palpation to the left dorsal midfoot.   No pain at the third fourth and fifth tarsometatarsal joint.  No pain with range of motion of the joints.  No pain at the metatarsophalangeal joints.  No extensor or flexor tendinitis noted.   Radiographs: 3 views of skeletally mature adult left foot: Osteoarthritic changes noted of the left first metatarsophalangeal joint severe in nature.  Severe osteoarthritic changes noted of the midfoot joints as well.  Flatfoot foot structure noted.  No other bony abnormalities identified Assessment:   1. Arthritis of midfoot     Plan:  Patient was evaluated and treated and all questions answered.  Left dorsal midfoot arthritis -Clinically healed with a steroid injection.  At this time I discussed shoe gear modification as well as Voltaren gel usage in extensive detail.  Patient states understanding.  If any foot and ankle issues arise she will come see me.  She states understanding.  No follow-ups on file.

## 2020-09-02 ENCOUNTER — Ambulatory Visit (INDEPENDENT_AMBULATORY_CARE_PROVIDER_SITE_OTHER): Payer: Medicare Other | Admitting: *Deleted

## 2020-09-02 ENCOUNTER — Other Ambulatory Visit: Payer: Self-pay

## 2020-09-02 ENCOUNTER — Ambulatory Visit: Payer: Medicare Other

## 2020-09-02 DIAGNOSIS — Z0000 Encounter for general adult medical examination without abnormal findings: Secondary | ICD-10-CM | POA: Diagnosis not present

## 2020-09-02 NOTE — Progress Notes (Signed)
Subjective:   Kathy Morgan is a 86 y.o. female who presents for Medicare Annual (Subsequent) preventive examination. Visit performed using audio Patient home Provider home Review of Systems    Defer to provider Cardiac Risk Factors include: none     Objective:    There were no vitals filed for this visit. There is no height or weight on file to calculate BMI.  Advanced Directives 02/23/2019 01/05/2019 07/08/2014  Does Patient Have a Medical Advance Directive? Yes Yes Yes  Type of Sales promotion account executive of [employer] Power of [place];Living will;Advance instruction for mental health treatment;Mental Health Advance Directive;Out of facility DNR (pink MOST or yellow form)  Does patient want to make changes to medical advance directive? No - Patient declined No - Patient declined -  Copy of Healthcare Power of Attorney in Chart? No - copy requested No - copy requested Yes    Current Medications (verified) Outpatient Encounter Medications as of 09/02/2020  Medication Sig   amLODipine (NORVASC) 5 MG tablet Take 1 tablet (5 mg total) by mouth daily.   atorvastatin (LIPITOR) 10 MG tablet Take 1 tablet (10 mg total) by mouth daily.   cholecalciferol (VITAMIN D) 400 UNITS TABS tablet Take 1,000 Units by mouth.   metoprolol tartrate (LOPRESSOR) 100 MG tablet TAKE 1 TABLET DAILY.   No facility-administered encounter medications on file as of 09/02/2020.    Allergies (verified) Patient has no known allergies.   History: Past Medical History:  Diagnosis Date   Arthritis    lower back   Hyperlipidemia    Hypertension    Vertigo    Wears dentures    full upper   Past Surgical History:  Procedure Laterality Date   BREAST SURGERY Right 2011   R cyst removal (benign)    CATARACT EXTRACTION W/PHACO Right 01/05/2019   Procedure: CATARACT EXTRACTION PHACO AND INTRAOCULAR LENS PLACEMENT (IOC) RIGHT 2.88  00:29.7;  Surgeon: Nevada Crane,  MD;  Location: [facility];  Service: Ophthalmology;  Laterality: Right;   CATARACT EXTRACTION W/PHACO Left 02/23/2019   Procedure: CATARACT EXTRACTION PHACO AND INTRAOCULAR LENS PLACEMENT (IOC) LEFT 4.68  00:41.3;  Surgeon: Nevada Crane, MD;  Location: [facility];  Service: Ophthalmology;  Laterality: Left;  needs to stay 2nd   VAGINAL HYSTERECTOMY  1991   Family History  Problem Relation Age of Onset   Cancer Mother    Dementia Father    Social History   Socioeconomic History   Marital status: Widowed    Spouse name: Not on file   Number of children: Not on file   Years of education: Not on file   Highest education level: Not on file  Occupational History   Not on file  Tobacco Use   Smoking status: Never   Smokeless tobacco: Never  Vaping Use   Vaping Use: Never used  Substance and Sexual Activity   Alcohol use: Yes    Alcohol/week: 2.0 standard drinks    Types: 2 Glasses of wine per week   Drug use: No   Sexual activity: Never  Other Topics Concern   Not on file  Social History Narrative   Not on file   Social Determinants of Health   Financial Resource Strain: Low Risk    Difficulty of Paying Living Expenses: Not hard at all  Food Insecurity: No Food Insecurity   Worried About Programme researcher, broadcasting/film/video in the Last Year: Never true   Ran Out  of Food in the Last Year: Never true  Transportation Needs: No Transportation Needs   Lack of Transportation (Medical): No   Lack of Transportation (Non-Medical): No  Physical Activity: Insufficiently Active   Days of Exercise per Week: 2 days   Minutes of Exercise per Session: 20 min  Stress: No Stress Concern Present   Feeling of Stress : Not at all  Social Connections: Moderately Integrated   Frequency of Communication with Friends and Family: More than three times a week   Frequency of Social Gatherings with Friends and Family: More than three times a week   Attends Religious Services: More than 4  times per year   Active Member of [employer]ubs or Organizations: Yes   Attends BankerClub or Organization Meetings: More than 4 times per year   Marital Status: Widowed    Tobacco Counseling Counseling given: Not Answered   Clinical Intake:  Pre-visit preparation completed: Yes  Pain : No/denies pain     Nutritional Risks: None Diabetes: No  How often do you need to have someone help you when you read instructions, pamphlets, or other written materials from your doctor or pharmacy?: 1 - Never What is the last grade level you completed in school?: 12  Diabetic?no  Interpreter Needed?: No  Information entered by :: Melody ComasAshley Suan Pyeatt   Activities of Daily Living In your present state of health, do you have any difficulty performing the following activities: 09/02/2020  Hearing? N  Vision? N  Difficulty concentrating or making decisions? N  Walking or climbing stairs? N  Dressing or bathing? N  Doing errands, shopping? N  Preparing Food and eating ? N  Using the Toilet? N  In the past six months, have you accidently leaked urine? N  Do you have problems with loss of bowel control? N  Managing your Medications? N  Managing your Finances? N  Housekeeping or managing your Housekeeping? N  Some recent data might be hidden    Patient Care Team: Corky DownsMasoud, Javed, MD as PCP - General (Internal Medicine)  Indicate any recent Medical Services you may have received from other than [facility] providers in the past year (date may be approximate).     Assessment:   This is a routine wellness examination for Kathy Morgan.  Hearing/Vision screen No results found.  Dietary issues and exercise activities discussed: Current Exercise Habits: Home exercise routine, Type of exercise: walking, Time (Minutes): 15, Frequency (Times/Week): 3, Weekly Exercise (Minutes/Week): 45, Intensity: Mild, Exercise limited by: None identified   Goals Addressed   None    Depression Screen PHQ 2/9 Scores 09/02/2020 09/02/2020  07/26/2020 12/14/2019  PHQ - 2 Score 0 0 0 0  PHQ- 9 Score - - - 0    Fall Risk Fall Risk  09/02/2020 07/12/2020 12/14/2019  Falls in the past year? 0 0 0  Number falls in past yr: 0 0 0  Injury with Fall? 0 0 0  Risk for fall due to : No Fall Risks No Fall Risks -  Follow up Falls evaluation completed Falls evaluation completed -    FALL RISK PREVENTION PERTAINING TO THE HOME:  Any stairs in or around the home? No  If so, are there any without handrails? No  Home free of loose throw rugs in walkways, pet beds, electrical cords, etc? Yes  Adequate lighting in your home to reduce risk of falls? Yes   ASSISTIVE DEVICES UTILIZED TO PREVENT FALLS:  Life alert? No  Use of a cane, walker or w/c?  No  Grab bars in the bathroom? No  Shower chair or bench in shower? No  Elevated toilet seat or a handicapped toilet? No   TIMED UP AND GO:  Was the test performed? No .  Length of time to ambulate 10 feet:  sec.   Gait steady and fast without use of assistive device  Cognitive Function: MMSE - Mini Mental State Exam 09/02/2020  Orientation to time 5  Orientation to Place 5  Registration 3  Attention/ Calculation 5  Recall 3  Language- name 2 objects 2  Language- repeat 1  Language- follow 3 step command 3  Language- read & follow direction 1  Write a sentence 1  Copy design 1  Total score 30     6CIT Screen 09/02/2020  What Year? 0 points  What month? 0 points  What time? 0 points  Count back from 20 0 points  Months in reverse 0 points  Repeat phrase 0 points  Total Score 0    Immunizations Immunization History  Administered Date(s) Administered   Influenza-Unspecified 11/02/2013, 09/10/2014   PFIZER(Purple Top)SARS-COV-2 Vaccination 03/01/2019, 03/23/2019, 12/18/2019   Pneumococcal Polysaccharide-23 09/10/2014   Zoster Recombinat (Shingrix) 06/04/2013    TDAP status: Due, Education has been provided regarding the importance of this vaccine. Advised may receive this  vaccine at local pharmacy or Health Dept. Aware to provide a copy of the vaccination record if obtained from local pharmacy or Health Dept. Verbalized acceptance and understanding.  Flu Vaccine status: Up to date  Pneumococcal vaccine status: Declined,  Education has been provided regarding the importance of this vaccine but patient still declined. Advised may receive this vaccine at local pharmacy or Health Dept. Aware to provide a copy of the vaccination record if obtained from local pharmacy or Health Dept. Verbalized acceptance and understanding.   Covid-19 vaccine status: Completed vaccines  Qualifies for Shingles Vaccine? Yes   Zostavax completed Yes   Shingrix Completed?: No.    Education has been provided regarding the importance of this vaccine. Patient has been advised to call insurance company to determine out of pocket expense if they have not yet received this vaccine. Advised may also receive vaccine at local pharmacy or Health Dept. Verbalized acceptance and understanding.  Screening Tests Health Maintenance  Topic Date Due   TETANUS/TDAP  Never done   DEXA SCAN  Never done   Zoster Vaccines- Shingrix (2 of 2) 07/30/2013   PNA vac Low Risk Adult (2 of 2 - PCV13) 09/10/2015   COVID-19 Vaccine (4 - Booster for Pfizer series) 04/17/2020   INFLUENZA VACCINE  08/01/2020   HPV VACCINES  Aged Out    Health Maintenance  Health Maintenance Due  Topic Date Due   TETANUS/TDAP  Never done   DEXA SCAN  Never done   Zoster Vaccines- Shingrix (2 of 2) 07/30/2013   PNA vac Low Risk Adult (2 of 2 - PCV13) 09/10/2015   COVID-19 Vaccine (4 - Booster for Pfizer series) 04/17/2020   INFLUENZA VACCINE  08/01/2020    Colorectal cancer screening: No longer required.   Mammogram status: No longer required due to age.  Bone Density status: Ordered  . Pt provided with contact info and advised to call to schedule appt.  Lung Cancer Screening: (Low Dose CT Chest recommended if Age 82-80  years, 30 pack-year currently smoking OR have quit w/in 15years.) does not qualify.   Lung Cancer Screening Referral: n  Additional Screening:  Hepatitis C Screening: does not qualify; Completed  Vision Screening: Recommended annual ophthalmology exams for early detection of glaucoma and other disorders of the eye. Is the patient up to date with their annual eye exam?  Yes  Who is the provider or what is the name of the office in which the patient attends annual eye exams? [place] If pt is not established with a provider, would they like to be referred to a provider to establish care? No .   Dental Screening: Recommended annual dental exams for proper oral hygiene  Community Resource Referral / Chronic Care Management: CRR required this visit?  No   CCM required this visit?  No      Plan:     I have personally reviewed and noted the following in the patient's chart:   Medical and social history Use of alcohol, tobacco or illicit drugs  Current medications and supplements including opioid prescriptions.  Functional ability and status Nutritional status Physical activity Advanced directives List of other physicians Hospitalizations, surgeries, and ER visits in previous 12 months Vitals Screenings to include cognitive, depression, and falls Referrals and appointments  In addition, I have reviewed and discussed with patient certain preventive protocols, quality metrics, and best practice recommendations. A written personalized care plan for preventive services as well as general preventive health recommendations were provided to patient.     Melody Comas, [place]   09/02/2020   Nurse Notes:  Ms. Abbasi , Thank you for taking time to come for your Medicare Wellness Visit. I appreciate your ongoing commitment to your health goals. Please review the following plan we discussed and let me know if I can assist you in the future.   These are the goals we discussed:  Goals    None     This is a list of the screening recommended for you and due dates:  Health Maintenance  Topic Date Due   Tetanus Vaccine  Never done   DEXA scan (bone density measurement)  Never done   Zoster (Shingles) Vaccine (2 of 2) 07/30/2013   Pneumonia vaccines (2 of 2 - PCV13) 09/10/2015   COVID-19 Vaccine (4 - Booster for Pfizer series) 04/17/2020   Flu Shot  08/01/2020   HPV Vaccine  Aged Out

## 2020-09-07 ENCOUNTER — Other Ambulatory Visit: Payer: Self-pay

## 2020-09-07 ENCOUNTER — Ambulatory Visit (INDEPENDENT_AMBULATORY_CARE_PROVIDER_SITE_OTHER): Payer: Medicare Other | Admitting: Internal Medicine

## 2020-09-07 ENCOUNTER — Encounter: Payer: Self-pay | Admitting: Internal Medicine

## 2020-09-07 VITALS — BP 143/76 | HR 65 | Ht 61.0 in | Wt 138.0 lb

## 2020-09-07 DIAGNOSIS — R053 Chronic cough: Secondary | ICD-10-CM | POA: Diagnosis not present

## 2020-09-07 DIAGNOSIS — E7800 Pure hypercholesterolemia, unspecified: Secondary | ICD-10-CM

## 2020-09-07 DIAGNOSIS — R42 Dizziness and giddiness: Secondary | ICD-10-CM

## 2020-09-07 DIAGNOSIS — S43421A Sprain of right rotator cuff capsule, initial encounter: Secondary | ICD-10-CM

## 2020-09-07 DIAGNOSIS — I10 Essential (primary) hypertension: Secondary | ICD-10-CM

## 2020-09-07 DIAGNOSIS — U099 Post covid-19 condition, unspecified: Secondary | ICD-10-CM

## 2020-09-07 NOTE — Assessment & Plan Note (Signed)
Hypercholesterolemia  I advised the patient to follow Mediterranean diet This diet is rich in fruits vegetables and whole grain, and This diet is also rich in fish and lean meat Patient should also eat a handful of almonds or walnuts daily Recent heart study indicated that average follow-up on this kind of diet reduces the cardiovascular mortality by 50 to 70%== 

## 2020-09-07 NOTE — Assessment & Plan Note (Signed)
She improved home a lot No wheezing was noted on examination

## 2020-09-07 NOTE — Assessment & Plan Note (Signed)
She does not have symptom of vertigo.

## 2020-09-07 NOTE — Assessment & Plan Note (Signed)

## 2020-09-07 NOTE — Progress Notes (Signed)
Established Patient Office Visit  Subjective:  Patient ID: Kathy Morgan, female    DOB: September 17, 1934  Age: 86 y.o. MRN: [id number]  CC:  Chief Complaint  Patient presents with   Hypertension    Hypertension   Kathy Morgan presents for check up  Past Medical History:  Diagnosis Date   Arthritis    lower back   Hyperlipidemia    Hypertension    Vertigo    Wears dentures    full upper    Past Surgical History:  Procedure Laterality Date   BREAST SURGERY Right 2011   R cyst removal (benign)    CATARACT EXTRACTION W/PHACO Right 01/05/2019   Procedure: CATARACT EXTRACTION PHACO AND INTRAOCULAR LENS PLACEMENT (IOC) RIGHT 2.88  00:29.7;  Surgeon: Nevada Crane, MD;  Location: [facility];  Service: Ophthalmology;  Laterality: Right;   CATARACT EXTRACTION W/PHACO Left 02/23/2019   Procedure: CATARACT EXTRACTION PHACO AND INTRAOCULAR LENS PLACEMENT (IOC) LEFT 4.68  00:41.3;  Surgeon: Nevada Crane, MD;  Location: [facility];  Service: Ophthalmology;  Laterality: Left;  needs to stay 2nd   VAGINAL HYSTERECTOMY  1991    Family History  Problem Relation Age of Onset   Cancer Mother    Dementia Father     Social History   Socioeconomic History   Marital status: Widowed    Spouse name: Not on file   Number of children: Not on file   Years of education: Not on file   Highest education level: Not on file  Occupational History   Not on file  Tobacco Use   Smoking status: Never   Smokeless tobacco: Never  Vaping Use   Vaping Use: Never used  Substance and Sexual Activity   Alcohol use: Yes    Alcohol/week: 2.0 standard drinks    Types: 2 Glasses of wine per week   Drug use: No   Sexual activity: Never  Other Topics Concern   Not on file  Social History Narrative   Not on file   Social Determinants of Health   Financial Resource Strain: Low Risk    Difficulty of Paying Living Expenses: Not hard at all  Food Insecurity: No Food  Insecurity   Worried About Programme researcher, broadcasting/film/video in the Last Year: Never true   Ran Out of Food in the Last Year: Never true  Transportation Needs: No Transportation Needs   Lack of Transportation (Medical): No   Lack of Transportation (Non-Medical): No  Physical Activity: Insufficiently Active   Days of Exercise per Week: 2 days   Minutes of Exercise per Session: 20 min  Stress: No Stress Concern Present   Feeling of Stress : Not at all  Social Connections: Moderately Integrated   Frequency of Communication with Friends and Family: More than three times a week   Frequency of Social Gatherings with Friends and Family: More than three times a week   Attends Religious Services: More than 4 times per year   Active Member of [employer] or Organizations: Yes   Attends Banker Meetings: More than 4 times per year   Marital Status: Widowed  Catering manager Violence: Not At Risk   Fear of Current or Ex-Partner: No   Emotionally Abused: No   Physically Abused: No   Sexually Abused: No     Current Outpatient Medications:    amLODipine (NORVASC) 5 MG tablet, Take 1 tablet (5 mg total) by mouth daily., Disp: 90 tablet, Rfl: 3   atorvastatin (  LIPITOR) 10 MG tablet, Take 1 tablet (10 mg total) by mouth daily., Disp: 90 tablet, Rfl: 3   cholecalciferol (VITAMIN D) 400 UNITS TABS tablet, Take 1,000 Units by mouth., Disp: , Rfl:    metoprolol tartrate (LOPRESSOR) 100 MG tablet, TAKE 1 TABLET DAILY., Disp: 90 tablet, Rfl: 3   No Known Allergies  ROS Review of Systems  Constitutional: Negative.   HENT: Negative.    Eyes: Negative.   Respiratory: Negative.    Cardiovascular: Negative.   Gastrointestinal: Negative.   Endocrine: Negative.   Genitourinary: Negative.   Musculoskeletal: Negative.   Skin: Negative.   Allergic/Immunologic: Negative.   Neurological: Negative.   Hematological: Negative.   Psychiatric/Behavioral: Negative.    All other systems reviewed and are  negative.    Objective:    Physical Exam Vitals reviewed.  Constitutional:      Appearance: Normal appearance.  HENT:     Mouth/Throat:     Mouth: Mucous membranes are moist.  Eyes:     Pupils: Pupils are equal, round, and reactive to light.  Neck:     Vascular: No carotid bruit.  Cardiovascular:     Rate and Rhythm: Normal rate and regular rhythm.     Pulses: Normal pulses.     Heart sounds: Normal heart sounds.  Pulmonary:     Effort: Pulmonary effort is normal.     Breath sounds: Normal breath sounds.  Abdominal:     General: Bowel sounds are normal.     Palpations: Abdomen is soft. There is no hepatomegaly, splenomegaly or mass.     Tenderness: There is no abdominal tenderness.     Hernia: No hernia is present.  Musculoskeletal:        General: No tenderness.     Cervical back: Neck supple.     Right lower leg: No edema.     Left lower leg: No edema.  Skin:    Findings: No rash.  Neurological:     Mental Status: She is alert and oriented to person, place, and time.     Motor: No weakness.  Psychiatric:        Mood and Affect: Mood and affect normal.        Behavior: Behavior normal.    BP (!) 143/76   Pulse 65   Ht 5\' 1"  (1.549 m)   Wt 138 lb (62.6 kg)   BMI 26.07 kg/m  Wt Readings from Last 3 Encounters:  09/07/20 138 lb (62.6 kg)  07/26/20 138 lb 4.8 oz (62.7 kg)  07/12/20 137 lb 3.2 oz (62.2 kg)     Health Maintenance Due  Topic Date Due   TETANUS/TDAP  Never done   DEXA SCAN  Never done   Zoster Vaccines- Shingrix (2 of 2) 07/30/2013   PNA vac Low Risk Adult (2 of 2 - PCV13) 09/10/2015   COVID-19 Vaccine (4 - Booster for Pfizer series) 04/17/2020   INFLUENZA VACCINE  08/01/2020    There are no preventive care reminders to display for this patient.  Lab Results  Component Value Date   TSH 3.72 12/04/2019   Lab Results  Component Value Date   WBC 5.3 07/02/2020   HGB 13.6 07/02/2020   HCT 39.7 07/02/2020   MCV 91.5 07/02/2020   PLT  191 07/02/2020   Lab Results  Component Value Date   NA 139 07/02/2020   K 3.5 07/02/2020   CO2 27 07/02/2020   GLUCOSE 102 (H) 07/02/2020   BUN 14 07/02/2020  CREATININE 0.75 07/02/2020   BILITOT 1.2 07/02/2020   ALKPHOS 72 07/02/2020   AST 27 07/02/2020   ALT 19 07/02/2020   PROT 8.5 (H) 07/02/2020   ALBUMIN 4.5 07/02/2020   CALCIUM 9.2 07/02/2020   ANIONGAP 5 07/02/2020   Lab Results  Component Value Date   CHOL 173 11/06/2018   Lab Results  Component Value Date   HDL 70 11/06/2018   Lab Results  Component Value Date   LDLCALC 94 11/06/2018   Lab Results  Component Value Date   TRIG 43 11/06/2018   Lab Results  Component Value Date   CHOLHDL 2.5 11/06/2018   No results found for: HGBA1C    Assessment & Plan:   Problem List Items Addressed This Visit       Cardiovascular and Mediastinum   Hypertension - Primary     Patient denies any chest pain or shortness of breath there is no history of palpitation or paroxysmal nocturnal dyspnea   patient was advised to follow low-salt low-cholesterol diet    ideally I want to keep systolic blood pressure below 709 mmHg, patient was asked to check blood pressure one times a week and give me a report on that.  Patient will be follow-up in 3 months  or earlier as needed, patient will call me back for any change in the cardiovascular symptoms Patient was advised to buy a book from local bookstore concerning blood pressure and read several chapters  every day.  This will be supplemented by some of the material we will give him from the office.  Patient should also utilize other resources like YouTube and Internet to learn more about the blood pressure and the diet.        Musculoskeletal and Integument   Sprain of right rotator cuff capsule    Rotator cuff is much better        Other   Hyperlipidemia    Hypercholesterolemia  I advised the patient to follow Mediterranean diet This diet is rich in fruits vegetables  and whole grain, and This diet is also rich in fish and lean meat Patient should also eat a handful of almonds or walnuts daily Recent heart study indicated that average follow-up on this kind of diet reduces the cardiovascular mortality by 50 to 70%==      Vertigo    She does not have symptom of vertigo.      Post-COVID chronic cough    She improved home a lot No wheezing was noted on examination       No orders of the defined types were placed in this encounter.   Follow-up: No follow-ups on file.    Corky Downs, MD

## 2020-09-07 NOTE — Assessment & Plan Note (Signed)
Rotator cuff is much better

## 2020-09-08 NOTE — Progress Notes (Signed)
I have reviewed this visit and agree with the documentation.   

## 2020-09-08 NOTE — Progress Notes (Deleted)
.  awv

## 2020-10-04 ENCOUNTER — Other Ambulatory Visit: Payer: Self-pay

## 2020-10-04 ENCOUNTER — Emergency Department
Admission: EM | Admit: 2020-10-04 | Discharge: 2020-10-04 | Disposition: A | Discharge status: Home / Self Care | Payer: Medicare Other | Attending: Emergency Medicine | Admitting: Emergency Medicine

## 2020-10-04 ENCOUNTER — Emergency Department: Payer: Medicare Other

## 2020-10-04 DIAGNOSIS — Z79899 Other long term (current) drug therapy: Secondary | ICD-10-CM | POA: Diagnosis not present

## 2020-10-04 DIAGNOSIS — I10 Essential (primary) hypertension: Secondary | ICD-10-CM | POA: Diagnosis not present

## 2020-10-04 DIAGNOSIS — R0981 Nasal congestion: Secondary | ICD-10-CM | POA: Diagnosis present

## 2020-10-04 DIAGNOSIS — J069 Acute upper respiratory infection, unspecified: Secondary | ICD-10-CM | POA: Insufficient documentation

## 2020-10-04 DIAGNOSIS — R072 Precordial pain: Secondary | ICD-10-CM | POA: Insufficient documentation

## 2020-10-04 DIAGNOSIS — Z8616 Personal history of COVID-19: Secondary | ICD-10-CM | POA: Insufficient documentation

## 2020-10-04 DIAGNOSIS — Z20822 Contact with and (suspected) exposure to covid-19: Secondary | ICD-10-CM | POA: Insufficient documentation

## 2020-10-04 DIAGNOSIS — R079 Chest pain, unspecified: Secondary | ICD-10-CM

## 2020-10-04 LAB — CBC
HCT: 39.7 % (ref 36.0–46.0)
Hemoglobin: 13.8 g/dL (ref 12.0–15.0)
MCH: 32.9 pg (ref 26.0–34.0)
MCHC: 34.8 g/dL (ref 30.0–36.0)
MCV: 94.7 fL (ref 80.0–100.0)
Platelets: 160 10*3/uL (ref 150–400)
RBC: 4.19 MIL/uL (ref 3.87–5.11)
RDW: 14.1 % (ref 11.5–15.5)
WBC: 6.3 10*3/uL (ref 4.0–10.5)
nRBC: 0 % (ref 0.0–0.2)

## 2020-10-04 LAB — BASIC METABOLIC PANEL
Anion gap: 7 (ref 5–15)
BUN: 17 mg/dL (ref 8–23)
CO2: 28 mmol/L (ref 22–32)
Calcium: 9.4 mg/dL (ref 8.9–10.3)
Chloride: 105 mmol/L (ref 98–111)
Creatinine, Ser: 0.85 mg/dL (ref 0.44–1.00)
GFR, Estimated: >60 mL/min (ref >60)
Glucose, Bld: 102 mg/dL — ABNORMAL HIGH (ref 70–99)
Potassium: 4 mmol/L (ref 3.5–5.1)
Sodium: 140 mmol/L (ref 135–145)

## 2020-10-04 LAB — TROPONIN I (HIGH SENSITIVITY)
Troponin I (High Sensitivity): 3 ng/L (ref <18)
Troponin I (High Sensitivity): 4 ng/L (ref <18)

## 2020-10-04 LAB — RESP PANEL BY RT-PCR (FLU A&B, COVID) ARPGX2
Influenza A by PCR: NEGATIVE
Influenza B by PCR: NEGATIVE
SARS Coronavirus 2 by RT PCR: NEGATIVE

## 2020-10-04 NOTE — ED Provider Notes (Signed)
[facility] Emergency Department Provider Note  ____________________________________________   Event Date/Time   First MD Initiated Contact with Patient 10/04/20 1455     (approximate)  I have reviewed the triage vital signs and the nursing notes.   HISTORY  Chief Complaint Chest Pain   HPI Kathy Morgan is a 86 y.o. female with past medical history of arthritis, HTN, HDL, and vertigo who presents for assessment of some prickly sensation in her substernal area starting yesterday associate with congestion and chills.  She denies any other chest pain, shortness of breath, cough, headache, earache, sore throat, nausea, vomiting, diarrhea, dysuria, abdominal pain, back pain, rash or extremity pain.  No recent falls or injuries.  She has no other acute concerns at this time.  She states that prickly sensation is not currently present.          Past Medical History:  Diagnosis Date   Arthritis    lower back   Hyperlipidemia    Hypertension    Vertigo    Wears dentures    full upper    Patient Active Problem List   Diagnosis Date Noted   Hallux valgus of right foot 07/12/2020   Post-COVID chronic cough 06/24/2020   Neck muscle spasm 02/26/2020   Sprain of right rotator cuff capsule 09/09/2019   Vertigo 09/09/2019   Hypertension    Hyperlipidemia     Past Surgical History:  Procedure Laterality Date   BREAST SURGERY Right 2011   R cyst removal (benign)    CATARACT EXTRACTION W/PHACO Right 01/05/2019   Procedure: CATARACT EXTRACTION PHACO AND INTRAOCULAR LENS PLACEMENT (IOC) RIGHT 2.88  00:29.7;  Surgeon: Nevada Crane, MD;  Location: [facility];  Service: Ophthalmology;  Laterality: Right;   CATARACT EXTRACTION W/PHACO Left 02/23/2019   Procedure: CATARACT EXTRACTION PHACO AND INTRAOCULAR LENS PLACEMENT (IOC) LEFT 4.68  00:41.3;  Surgeon: Nevada Crane, MD;  Location: [facility];  Service: Ophthalmology;  Laterality:  Left;  needs to stay 2nd   VAGINAL HYSTERECTOMY  1991    Prior to Admission medications   Medication Sig Start Date End Date Taking? Authorizing Provider  amLODipine (NORVASC) 5 MG tablet Take 1 tablet (5 mg total) by mouth daily. 07/12/20   Corky Downs, MD  atorvastatin (LIPITOR) 10 MG tablet Take 1 tablet (10 mg total) by mouth daily. 04/22/20   Corky Downs, MD  cholecalciferol (VITAMIN D) 400 UNITS TABS tablet Take 1,000 Units by mouth.    [provider]  metoprolol tartrate (LOPRESSOR) 100 MG tablet TAKE 1 TABLET DAILY. 04/22/20   Corky Downs, MD    Allergies Patient has no known allergies.  Family History  Problem Relation Age of Onset   Cancer Mother    Dementia Father     Social History Social History   Tobacco Use   Smoking status: Never   Smokeless tobacco: Never  Vaping Use   Vaping Use: Never used  Substance Use Topics   Alcohol use: Yes    Alcohol/week: 2.0 standard drinks    Types: 2 Glasses of wine per week   Drug use: No    Review of Systems  Review of Systems  Constitutional:  Positive for chills. Negative for fever.  HENT:  Positive for congestion. Negative for sore throat.   Eyes:  Negative for pain.  Respiratory:  Negative for cough and stridor.   Cardiovascular:  Positive for chest pain ("not pain, prickly sensation).  Gastrointestinal:  Negative for vomiting.  Genitourinary:  Negative for dysuria.  Musculoskeletal:  Negative for myalgias.  Skin:  Negative for rash.  Neurological:  Negative for seizures, loss of consciousness and headaches.  Psychiatric/Behavioral:  Negative for suicidal ideas.   All other systems reviewed and are negative.    ____________________________________________   PHYSICAL EXAM:  VITAL SIGNS: ED Triage Vitals  Enc Vitals Group     BP 10/04/20 1209 (!) 153/66     Pulse Rate 10/04/20 1209 65     Resp 10/04/20 1209 18     Temp 10/04/20 1209 98.3 F (36.8 C)     Temp Source 10/04/20 1209 Oral      SpO2 10/04/20 1209 98 %     Weight --      Height --      Head Circumference --      Peak Flow --      Pain Score 10/04/20 1211 0     Pain Loc --      Pain Edu? --      Excl. in GC? --    Vitals:   10/04/20 1530 10/04/20 1600  BP: (!) 131/104 (!) 142/53  Pulse: 77 69  Resp:    Temp:    SpO2: 98% 100%   Physical Exam Vitals and nursing note reviewed.  Constitutional:      General: She is not in acute distress.    Appearance: She is well-developed.  HENT:     Head: Normocephalic and atraumatic.     Right Ear: External ear normal.     Left Ear: External ear normal.     Nose: Nose normal.  Eyes:     Conjunctiva/sclera: Conjunctivae normal.  Cardiovascular:     Rate and Rhythm: Normal rate and regular rhythm.     Heart sounds: No murmur heard. Pulmonary:     Effort: Pulmonary effort is normal. No respiratory distress.     Breath sounds: Normal breath sounds.  Abdominal:     Palpations: Abdomen is soft.     Tenderness: There is no abdominal tenderness.  Musculoskeletal:     Cervical back: Neck supple.  Skin:    General: Skin is warm and dry.     Capillary Refill: Capillary refill takes less than 2 seconds.  Neurological:     Mental Status: She is alert and oriented to person, place, and time.  Psychiatric:        Mood and Affect: Mood normal.    Cranial nerves II through XII are grossly intact.  Oropharynx is unremarkable.Marland Kitchen  Anterior chest wall has no rash. ____________________________________________   LABS (all labs ordered are listed, but only abnormal results are displayed)  Labs Reviewed  BASIC METABOLIC PANEL - Abnormal; Notable for the following components:      Result Value   Glucose, Bld 102 (*)    All other components within normal limits  RESP PANEL BY RT-PCR (FLU A&B, COVID) ARPGX2  CBC  TROPONIN I (HIGH SENSITIVITY)  TROPONIN I (HIGH SENSITIVITY)   ____________________________________________  EKG  Sinus rhythm with a ventricular rate of 63,  unremarkable intervals, normal axis and nonspecific ST change in inferior leads without other clear evidence of acute ischemia or significant arrhythmia. ____________________________________________  RADIOLOGY  ED MD interpretation: Chest x-ray has no effusion, edema, pneumothorax, focal consolidation or other clear acute thoracic process.  Official radiology report(s): DG Chest 2 View  Result Date: 10/04/2020 CLINICAL DATA:  Chest pain EXAM: CHEST - 2 VIEW COMPARISON:  12/17/2012 chest radiograph. FINDINGS: Stable cardiomediastinal  silhouette with normal heart size. No pneumothorax. No pleural effusion. Lungs appear clear, with no acute consolidative airspace disease and no pulmonary edema. IMPRESSION: No active cardiopulmonary disease. Electronically Signed   By: Delbert Phenix M.D.   On: 10/04/2020 13:23    ____________________________________________   PROCEDURES  Procedure(s) performed (including Critical Care):  .1-3 Lead EKG Interpretation Performed by: Gilles Chiquito, MD Authorized by: Gilles Chiquito, MD     Interpretation: normal     ECG rate assessment: normal     Rhythm: sinus rhythm     Ectopy: none     Conduction: normal     ____________________________________________   INITIAL IMPRESSION / ASSESSMENT AND PLAN / ED COURSE      Patient presents with above-stated history exam for assessment of some chills and congestion that started yesterday as well as a prickly sensation in her mid chest area yesterday and today.  She is currently chest pain-free.  Not clearly exertional or positional.  She denies any other clear associated sick symptoms.  She is slightly hypertensive with otherwise stable vital signs on arrival.  Differential includes viral URI, pneumonia, atypical presentation for ACS, anemia, metabolic derangements with very low suspicion for PE given absence of any history of shortness of breath or evidence of tachycardia, tachypnea or hypoxia.  In addition  I will suspicion for dissection at this time.  Sinus rhythm with a ventricular rate of 63, unremarkable intervals, normal axis and nonspecific ST change in inferior leads without other clear evidence of acute ischemia or significant arrhythmia.  Troponins nonelevated x2 are not suggestive of atypical presentation for ACS.  Chest x-ray has no effusion, edema, pneumothorax, focal consolidation or other clear acute thoracic process.  BMP shows no significant electrolyte or metabolic derangements.  CBC shows no cytosis or acute anemia.  COVID influenza PCR is negative.  No evidence of deep space infection in the head or neck i.e. mastoiditis, peritonsillar abscess, retropharyngeal abscess and patient does not appear septic or meningitic.  Given stable vitals advised patient that work-up I have a low suspicion for immediate life-threatening pathology and I believe patient is stable for discharge with close outpatient follow-up.  Discharged stable condition.  Strict return precautions advised and discussed.   ____________________________________________   FINAL CLINICAL IMPRESSION(S) / ED DIAGNOSES  Final diagnoses:  Chest pain, unspecified type  Upper respiratory tract infection, unspecified type    Medications - No data to display   ED Discharge Orders     None        Note:  This document was prepared using Dragon voice recognition software and may include unintentional dictation errors.    Gilles Chiquito, MD 10/04/20 [phone]

## 2020-10-04 NOTE — ED Triage Notes (Signed)
Pt comes with c/o burning in upper mid chest area. Pt states it happened yesterday and then again today. Pt denies any SOB.

## 2020-11-15 ENCOUNTER — Other Ambulatory Visit: Payer: Self-pay | Admitting: Internal Medicine

## 2020-12-01 ENCOUNTER — Ambulatory Visit (INDEPENDENT_AMBULATORY_CARE_PROVIDER_SITE_OTHER): Payer: Medicare Other | Admitting: Podiatry

## 2020-12-01 ENCOUNTER — Other Ambulatory Visit: Payer: Self-pay

## 2020-12-01 DIAGNOSIS — M19072 Primary osteoarthritis, left ankle and foot: Secondary | ICD-10-CM

## 2020-12-01 DIAGNOSIS — M19079 Primary osteoarthritis, unspecified ankle and foot: Secondary | ICD-10-CM

## 2020-12-06 ENCOUNTER — Encounter: Payer: Self-pay | Admitting: Podiatry

## 2020-12-06 NOTE — Progress Notes (Signed)
  Subjective:  Patient ID: Kathy Morgan, female    DOB: 1934-12-18,  MRN: [id number]  Chief Complaint  Patient presents with   Arthritis    Pt would like another injection in her foot    86 y.o. female presents with the above complaint.  Patient presents for follow-up of left dorsal midfoot pain/arthritic flareup.  She states that the injection helped considerably gave her a few months of relief.  She would like to know if she can get another injection.  She started to have some pain.  She denies any other acute complaints.   Review of Systems: Negative except as noted in the HPI. Denies N/V/F/Ch.  Past Medical History:  Diagnosis Date   Arthritis    lower back   Hyperlipidemia    Hypertension    Vertigo    Wears dentures    full upper    Current Outpatient Medications:    amLODipine (NORVASC) 5 MG tablet, Take 1 tablet (5 mg total) by mouth daily., Disp: 90 tablet, Rfl: 3   atorvastatin (LIPITOR) 10 MG tablet, Take 1 tablet (10 mg total) by mouth daily., Disp: 90 tablet, Rfl: 3   cholecalciferol (VITAMIN D) 400 UNITS TABS tablet, Take 1,000 Units by mouth., Disp: , Rfl:    metoprolol succinate (TOPROL-XL) 100 MG 24 hr tablet, TAKE 1 TABLET DAILY WITH ORIMMEDIATELY FOLLOWING A    MEAL, Disp: 90 tablet, Rfl: 3   metoprolol tartrate (LOPRESSOR) 100 MG tablet, TAKE 1 TABLET DAILY., Disp: 90 tablet, Rfl: 3  Social History   Tobacco Use  Smoking Status Never  Smokeless Tobacco Never    No Known Allergies Objective:  There were no vitals filed for this visit. There is no height or weight on file to calculate BMI. Constitutional Well developed. Well nourished.  Vascular Dorsalis pedis pulses palpable bilaterally. Posterior tibial pulses palpable bilaterally. Capillary refill normal to all digits.  No cyanosis or clubbing noted. Pedal hair growth normal.  Neurologic Normal speech. Oriented to person, place, and time. Epicritic sensation to light touch grossly present  bilaterally.  Dermatologic Nails well groomed and normal in appearance. No open wounds. No skin lesions.  Orthopedic: Pain on palpation to the left dorsal midfoot.  Pain at the third fourth and fifth tarsometatarsal joint.  Pain with range of motion of the joints.  No pain at the metatarsophalangeal joints.  No extensor or flexor tendinitis noted.   Radiographs: 3 views of skeletally mature adult left foot: Osteoarthritic changes noted of the left first metatarsophalangeal joint severe in nature.  Severe osteoarthritic changes noted of the midfoot joints as well.  Flatfoot foot structure noted.  No other bony abnormalities identified Assessment:   1. Arthritis of midfoot     Plan:  Patient was evaluated and treated and all questions answered.  Left dorsal midfoot arthritis -I explained the patient the etiology of arthritis and various treatment options were discussed.  Given the amount of pain she is having I believe she will benefit from a steroid injection to help decrease acute inflammatory component associated pain.  Patient agrees with plan like to proceed with a steroid injection. -A steroid injection was performed at left dorsal midfoot at point of maximal tenderness using 1% plain Lidocaine and 10 mg of Kenalog. This was well tolerated. -Continue using Voltaren gel over-the-counter.  No follow-ups on file.

## 2020-12-14 ENCOUNTER — Telehealth: Payer: Self-pay | Admitting: *Deleted

## 2020-12-14 NOTE — Telephone Encounter (Signed)
"  I came in about a week ago or so.  I got a shot in my foot.  It's still the same.  It hasn't seemed to work at all.  Is there something else he can suggest?  I think I may need to make another appointment.  Give me a call.

## 2020-12-15 NOTE — Telephone Encounter (Signed)
I scheduled her for 12/20/2020 at 11:15 am.

## 2020-12-20 ENCOUNTER — Encounter: Payer: Self-pay | Admitting: Podiatry

## 2020-12-20 ENCOUNTER — Ambulatory Visit (INDEPENDENT_AMBULATORY_CARE_PROVIDER_SITE_OTHER): Payer: Medicare Other | Admitting: Podiatry

## 2020-12-20 ENCOUNTER — Other Ambulatory Visit: Payer: Self-pay

## 2020-12-20 DIAGNOSIS — M19072 Primary osteoarthritis, left ankle and foot: Secondary | ICD-10-CM

## 2020-12-20 DIAGNOSIS — M19079 Primary osteoarthritis, unspecified ankle and foot: Secondary | ICD-10-CM

## 2020-12-20 NOTE — Progress Notes (Signed)
°  Subjective:  Patient ID: Kathy Morgan, female    DOB: 12-25-1934,  MRN: [id number]  Chief Complaint  Patient presents with   Arthritis    Pt stated that she is still having a lot of pain in her foot     86 y.o. female presents with the above complaint.  Patient presents for follow-up of left dorsal midfoot pain/arthritic flareup.  She states the injection did not help much this time.  She says she got a little bit of relief but it came right back.  She wondered if she could do another injection as pretty much the last 5.  She does not want to do physical therapy or PRP injection at this time  Review of Systems: Negative except as noted in the HPI. Denies N/V/F/Ch.  Past Medical History:  Diagnosis Date   Arthritis    lower back   Hyperlipidemia    Hypertension    Vertigo    Wears dentures    full upper    Current Outpatient Medications:    amLODipine (NORVASC) 5 MG tablet, Take 1 tablet (5 mg total) by mouth daily., Disp: 90 tablet, Rfl: 3   atorvastatin (LIPITOR) 10 MG tablet, Take 1 tablet (10 mg total) by mouth daily., Disp: 90 tablet, Rfl: 3   cholecalciferol (VITAMIN D) 400 UNITS TABS tablet, Take 1,000 Units by mouth., Disp: , Rfl:    metoprolol succinate (TOPROL-XL) 100 MG 24 hr tablet, TAKE 1 TABLET DAILY WITH ORIMMEDIATELY FOLLOWING A    MEAL, Disp: 90 tablet, Rfl: 3   metoprolol tartrate (LOPRESSOR) 100 MG tablet, TAKE 1 TABLET DAILY., Disp: 90 tablet, Rfl: 3  Social History   Tobacco Use  Smoking Status Never  Smokeless Tobacco Never    No Known Allergies Objective:  There were no vitals filed for this visit. There is no height or weight on file to calculate BMI. Constitutional Well developed. Well nourished.  Vascular Dorsalis pedis pulses palpable bilaterally. Posterior tibial pulses palpable bilaterally. Capillary refill normal to all digits.  No cyanosis or clubbing noted. Pedal hair growth normal.  Neurologic Normal speech. Oriented to person,  place, and time. Epicritic sensation to light touch grossly present bilaterally.  Dermatologic Nails well groomed and normal in appearance. No open wounds. No skin lesions.  Orthopedic: Pain on palpation to the left dorsal midfoot.  Pain at the third fourth and fifth tarsometatarsal joint.  Pain with range of motion of the joints.  No pain at the metatarsophalangeal joints.  No extensor or flexor tendinitis noted.   Radiographs: 3 views of skeletally mature adult left foot: Osteoarthritic changes noted of the left first metatarsophalangeal joint severe in nature.  Severe osteoarthritic changes noted of the midfoot joints as well.  Flatfoot foot structure noted.  No other bony abnormalities identified Assessment:   1. Arthritis of midfoot      Plan:  Patient was evaluated and treated and all questions answered.  Left dorsal midfoot arthritis -I explained the patient the etiology of arthritis and various treatment options were discussed.  Given the amount of pain she is having I believe she will benefit from a steroid injection to help decrease acute inflammatory component associated pain.  Patient agrees with plan like to proceed with a steroid injection. -Another steroid injection was performed at left dorsal midfoot at point of maximal tenderness using 1% plain Lidocaine and 10 mg of Kenalog. This was well tolerated. -Continue using Voltaren gel over-the-counter.  No follow-ups on file.

## 2021-01-09 ENCOUNTER — Ambulatory Visit: Payer: Medicare Other | Admitting: Internal Medicine

## 2021-01-10 ENCOUNTER — Ambulatory Visit (INDEPENDENT_AMBULATORY_CARE_PROVIDER_SITE_OTHER): Payer: Medicare Other | Admitting: *Deleted

## 2021-01-10 ENCOUNTER — Other Ambulatory Visit: Payer: Self-pay

## 2021-01-10 DIAGNOSIS — I10 Essential (primary) hypertension: Secondary | ICD-10-CM | POA: Diagnosis not present

## 2021-01-10 DIAGNOSIS — E7800 Pure hypercholesterolemia, unspecified: Secondary | ICD-10-CM

## 2021-01-11 LAB — COMPLETE METABOLIC PANEL WITH GFR
AG Ratio: 1.5 (calc) (ref 1.0–2.5)
ALT: 16 U/L (ref 6–29)
AST: 24 U/L (ref 10–35)
Albumin: 4.5 g/dL (ref 3.6–5.1)
Alkaline phosphatase (APISO): 77 U/L (ref 37–153)
BUN: 15 mg/dL (ref 7–25)
CO2: 27 mmol/L (ref 20–32)
Calcium: 9.3 mg/dL (ref 8.6–10.4)
Chloride: 103 mmol/L (ref 98–110)
Creat: 0.8 mg/dL (ref 0.60–0.95)
Globulin: 3.1 g/dL (calc) (ref 1.9–3.7)
Glucose, Bld: 88 mg/dL (ref 65–99)
Potassium: 4.3 mmol/L (ref 3.5–5.3)
Sodium: 140 mmol/L (ref 135–146)
Total Bilirubin: 0.9 mg/dL (ref 0.2–1.2)
Total Protein: 7.6 g/dL (ref 6.1–8.1)
eGFR: 73 mL/min/{1.73_m2} (ref >=60)

## 2021-01-11 LAB — CBC WITH DIFFERENTIAL/PLATELET
Absolute Monocytes: 561 cells/uL (ref 200–950)
Basophils Absolute: 40 cells/uL (ref 0–200)
Basophils Relative: 0.6 %
Eosinophils Absolute: 92 cells/uL (ref 15–500)
Eosinophils Relative: 1.4 %
HCT: 41 % (ref 35.0–45.0)
Hemoglobin: 13.6 g/dL (ref 11.7–15.5)
Lymphs Abs: 2231 cells/uL (ref 850–3900)
MCH: 32.1 pg (ref 27.0–33.0)
MCHC: 33.2 g/dL (ref 32.0–36.0)
MCV: 96.7 fL (ref 80.0–100.0)
MPV: 11.6 fL (ref 7.5–12.5)
Monocytes Relative: 8.5 %
Neutro Abs: 3676 cells/uL (ref 1500–7800)
Neutrophils Relative %: 55.7 %
Platelets: 169 10*3/uL (ref 140–400)
RBC: 4.24 10*6/uL (ref 3.80–5.10)
RDW: 12.5 % (ref 11.0–15.0)
Total Lymphocyte: 33.8 %
WBC: 6.6 10*3/uL (ref 3.8–10.8)

## 2021-01-11 LAB — LIPID PANEL
Cholesterol: 183 mg/dL (ref <200)
HDL: 75 mg/dL (ref >=50)
LDL Cholesterol (Calc): 93 mg/dL (calc)
Non-HDL Cholesterol (Calc): 108 mg/dL (calc) (ref <130)
Total CHOL/HDL Ratio: 2.4 (calc) (ref <5.0)
Triglycerides: 61 mg/dL (ref <150)

## 2021-01-11 LAB — TSH: TSH: 2.6 mIU/L (ref 0.40–4.50)

## 2021-01-18 ENCOUNTER — Encounter: Payer: Self-pay | Admitting: Internal Medicine

## 2021-01-18 ENCOUNTER — Other Ambulatory Visit: Payer: Self-pay

## 2021-01-18 ENCOUNTER — Ambulatory Visit (INDEPENDENT_AMBULATORY_CARE_PROVIDER_SITE_OTHER): Payer: Medicare Other | Admitting: Internal Medicine

## 2021-01-18 VITALS — BP 134/78 | HR 67 | Ht 61.0 in | Wt 135.1 lb

## 2021-01-18 DIAGNOSIS — E7800 Pure hypercholesterolemia, unspecified: Secondary | ICD-10-CM

## 2021-01-18 DIAGNOSIS — M62838 Other muscle spasm: Secondary | ICD-10-CM | POA: Diagnosis not present

## 2021-01-18 DIAGNOSIS — S43421A Sprain of right rotator cuff capsule, initial encounter: Secondary | ICD-10-CM

## 2021-01-18 DIAGNOSIS — R42 Dizziness and giddiness: Secondary | ICD-10-CM

## 2021-01-18 DIAGNOSIS — I10 Essential (primary) hypertension: Secondary | ICD-10-CM | POA: Diagnosis not present

## 2021-01-18 NOTE — Progress Notes (Signed)
Established Patient Office Visit  Subjective:  Patient ID: Kathy Morgan, female    DOB: 07-28-1934  Age: 87 y.o. MRN: [id number]  CC:  Chief Complaint  Patient presents with   lab results    HPI  Kathy Morgan presents for check up  Past Medical History:  Diagnosis Date   Arthritis    lower back   Hyperlipidemia    Hypertension    Vertigo    Wears dentures    full upper    Past Surgical History:  Procedure Laterality Date   BREAST SURGERY Right 2011   R cyst removal (benign)    CATARACT EXTRACTION W/PHACO Right 01/05/2019   Procedure: CATARACT EXTRACTION PHACO AND INTRAOCULAR LENS PLACEMENT (IOC) RIGHT 2.88  00:29.7;  Surgeon: Eulogio Bear, MD;  Location: [place];  Service: Ophthalmology;  Laterality: Right;   CATARACT EXTRACTION W/PHACO Left 02/23/2019   Procedure: CATARACT EXTRACTION PHACO AND INTRAOCULAR LENS PLACEMENT (IOC) LEFT 4.68  00:41.3;  Surgeon: Eulogio Bear, MD;  Location: [place];  Service: Ophthalmology;  Laterality: Left;  needs to stay 2nd   [place]    Family History  Problem Relation Age of Onset   Cancer Mother    Dementia Father     Social History   Socioeconomic History   Marital status: Widowed    Spouse name: Not on file   Number of children: Not on file   Years of education: Not on file   Highest education level: Not on file  Occupational History   Not on file  Tobacco Use   Smoking status: Never   Smokeless tobacco: Never  Vaping Use   Vaping Use: Never used  Substance and Sexual Activity   Alcohol use: Yes    Alcohol/week: 2.0 standard drinks    Types: 2 Glasses of wine per week   Drug use: No   Sexual activity: Never  Other Topics Concern   Not on file  Social History Narrative   Not on file   Social Determinants of Health   Financial Resource Strain: Low Risk    Difficulty of Paying Living Expenses: Not hard at all  Food Insecurity: No Food Insecurity    Worried About Charity fundraiser in the Last Year: Never true   [place] in the Last Year: Never true  Transportation Needs: No Transportation Needs   Lack of Transportation (Medical): No   Lack of Transportation (Non-Medical): No  Physical Activity: Insufficiently Active   Days of Exercise per Week: 2 days   Minutes of Exercise per Session: 20 min  Stress: No Stress Concern Present   Feeling of Stress : Not at all  Social Connections: Moderately Integrated   Frequency of Communication with Friends and Family: More than three times a week   Frequency of Social Gatherings with Friends and Family: More than three times a week   Attends Religious Services: More than 4 times per year   Active Member of [employer] or Organizations: Yes   Attends Archivist Meetings: More than 4 times per year   Marital Status: Widowed  Human resources officer Violence: Not At Risk   Fear of Current or Ex-Partner: No   Emotionally Abused: No   Physically Abused: No   Sexually Abused: No     Current Outpatient Medications:    amLODipine (NORVASC) 5 MG tablet, Take 1 tablet (5 mg total) by mouth daily., Disp: 90 tablet, Rfl: 3   atorvastatin (  LIPITOR) 10 MG tablet, Take 1 tablet (10 mg total) by mouth daily., Disp: 90 tablet, Rfl: 3   cholecalciferol (VITAMIN D) 400 UNITS TABS tablet, Take 1,000 Units by mouth., Disp: , Rfl:    metoprolol succinate (TOPROL-XL) 100 MG 24 hr tablet, TAKE 1 TABLET DAILY WITH ORIMMEDIATELY FOLLOWING A    MEAL, Disp: 90 tablet, Rfl: 3   metoprolol tartrate (LOPRESSOR) 100 MG tablet, TAKE 1 TABLET DAILY., Disp: 90 tablet, Rfl: 3   No Known Allergies  ROS Review of Systems  Constitutional: Negative.   HENT: Negative.    Eyes: Negative.   Respiratory: Negative.    Cardiovascular: Negative.   Gastrointestinal: Negative.   Endocrine: Negative.   Genitourinary: Negative.   Musculoskeletal: Negative.   Skin: Negative.   Allergic/Immunologic: Negative.   Neurological:  Negative.   Hematological: Negative.   Psychiatric/Behavioral: Negative.    All other systems reviewed and are negative.    Objective:    Physical Exam Vitals reviewed.  Constitutional:      Appearance: Normal appearance.  HENT:     Mouth/Throat:     Mouth: Mucous membranes are moist.  Eyes:     Pupils: Pupils are equal, round, and reactive to light.  Neck:     Vascular: No carotid bruit.  Cardiovascular:     Rate and Rhythm: Normal rate and regular rhythm.     Pulses: Normal pulses.     Heart sounds: Normal heart sounds.  Pulmonary:     Effort: Pulmonary effort is normal.     Breath sounds: Normal breath sounds.  Abdominal:     General: Bowel sounds are normal.     Palpations: Abdomen is soft. There is no hepatomegaly, splenomegaly or mass.     Tenderness: There is no abdominal tenderness.     Hernia: No hernia is present.  Musculoskeletal:        General: No tenderness.     Cervical back: Neck supple.     Right lower leg: No edema.     Left lower leg: No edema.  Skin:    Findings: No rash.  Neurological:     Mental Status: She is alert and oriented to person, place, and time.     Motor: No weakness.  Psychiatric:        Mood and Affect: Mood and affect normal.        Behavior: Behavior normal.    There were no vitals taken for this visit. Wt Readings from Last 3 Encounters:  09/07/20 138 lb (62.6 kg)  07/26/20 138 lb 4.8 oz (62.7 kg)  07/12/20 137 lb 3.2 oz (62.2 kg)     Health Maintenance Due  Topic Date Due   TETANUS/TDAP  Never done   DEXA SCAN  Never done   Zoster Vaccines- Shingrix (2 of 2) 07/30/2013   Pneumonia Vaccine 61+ Years old (2 - PCV) 09/10/2015   COVID-19 Vaccine (4 - Booster for Pfizer series) 02/12/2020   INFLUENZA VACCINE  08/01/2020    There are no preventive care reminders to display for this patient.  Lab Results  Component Value Date   TSH 2.60 01/10/2021   Lab Results  Component Value Date   WBC 6.6 01/10/2021   HGB  13.6 01/10/2021   HCT 41.0 01/10/2021   MCV 96.7 01/10/2021   PLT 169 01/10/2021   Lab Results  Component Value Date   NA 140 01/10/2021   K 4.3 01/10/2021   CO2 27 01/10/2021   GLUCOSE 88 01/10/2021  BUN 15 01/10/2021   CREATININE 0.80 01/10/2021   BILITOT 0.9 01/10/2021   ALKPHOS 72 07/02/2020   AST 24 01/10/2021   ALT 16 01/10/2021   PROT 7.6 01/10/2021   ALBUMIN 4.5 07/02/2020   CALCIUM 9.3 01/10/2021   ANIONGAP 7 10/04/2020   EGFR 73 01/10/2021   Lab Results  Component Value Date   CHOL 183 01/10/2021   Lab Results  Component Value Date   HDL 75 01/10/2021   Lab Results  Component Value Date   LDLCALC 93 01/10/2021   Lab Results  Component Value Date   TRIG 61 01/10/2021   Lab Results  Component Value Date   CHOLHDL 2.4 01/10/2021   No results found for: HGBA1C    Assessment & Plan:   Problem List Items Addressed This Visit   None   No orders of the defined types were placed in this encounter.   Follow-up: No follow-ups on file.    Cletis Athens, MD

## 2021-01-18 NOTE — Assessment & Plan Note (Signed)
Stable at the present time. 

## 2021-01-18 NOTE — Assessment & Plan Note (Signed)
Stable

## 2021-01-18 NOTE — Assessment & Plan Note (Signed)
Hypercholesterolemia  I advised the patient to follow Mediterranean diet This diet is rich in fruits vegetables and whole grain, and This diet is also rich in fish and lean meat Patient should also eat a handful of almonds or walnuts daily Recent heart study indicated that average follow-up on this kind of diet reduces the cardiovascular mortality by 50 to 70%== 

## 2021-01-18 NOTE — Assessment & Plan Note (Signed)

## 2021-01-24 ENCOUNTER — Ambulatory Visit (INDEPENDENT_AMBULATORY_CARE_PROVIDER_SITE_OTHER): Payer: Medicare Other | Admitting: Podiatry

## 2021-01-24 ENCOUNTER — Other Ambulatory Visit: Payer: Self-pay

## 2021-01-24 DIAGNOSIS — M19079 Primary osteoarthritis, unspecified ankle and foot: Secondary | ICD-10-CM

## 2021-01-24 NOTE — Progress Notes (Signed)
°  Subjective:  Patient ID: Kathy Morgan, female    DOB: 12-24-1934,  MRN: [id number]  Chief Complaint  Patient presents with   Arthritis    87 y.o. female presents with the above complaint.  Patient presents for follow-up of left dorsal midfoot pain/arthritic flareup.  She states that the injection helped a little bit.  She has on and off pain.  She does not want to do another injection.  She would like to discuss about maintenance steroid injection.  Review of Systems: Negative except as noted in the HPI. Denies N/V/F/Ch.  Past Medical History:  Diagnosis Date   Arthritis    lower back   Hyperlipidemia    Hypertension    Vertigo    Wears dentures    full upper    Current Outpatient Medications:    amLODipine (NORVASC) 5 MG tablet, Take 1 tablet (5 mg total) by mouth daily., Disp: 90 tablet, Rfl: 3   atorvastatin (LIPITOR) 10 MG tablet, Take 1 tablet (10 mg total) by mouth daily., Disp: 90 tablet, Rfl: 3   cholecalciferol (VITAMIN D) 400 UNITS TABS tablet, Take 1,000 Units by mouth., Disp: , Rfl:    metoprolol succinate (TOPROL-XL) 100 MG 24 hr tablet, TAKE 1 TABLET DAILY WITH ORIMMEDIATELY FOLLOWING A    MEAL, Disp: 90 tablet, Rfl: 3   metoprolol tartrate (LOPRESSOR) 100 MG tablet, TAKE 1 TABLET DAILY., Disp: 90 tablet, Rfl: 3  Social History   Tobacco Use  Smoking Status Never  Smokeless Tobacco Never    No Known Allergies Objective:  There were no vitals filed for this visit. There is no height or weight on file to calculate BMI. Constitutional Well developed. Well nourished.  Vascular Dorsalis pedis pulses palpable bilaterally. Posterior tibial pulses palpable bilaterally. Capillary refill normal to all digits.  No cyanosis or clubbing noted. Pedal hair growth normal.  Neurologic Normal speech. Oriented to person, place, and time. Epicritic sensation to light touch grossly present bilaterally.  Dermatologic Nails well groomed and normal in appearance. No open  wounds. No skin lesions.  Orthopedic: Very mild pain on palpation to the left dorsal midfoot.  Very mild pain at the third fourth and fifth tarsometatarsal joint.  Mild pain with range of motion of the joints.  No pain at the metatarsophalangeal joints.  No extensor or flexor tendinitis noted.   Radiographs: 3 views of skeletally mature adult left foot: Osteoarthritic changes noted of the left first metatarsophalangeal joint severe in nature.  Severe osteoarthritic changes noted of the midfoot joints as well.  Flatfoot foot structure noted.  No other bony abnormalities identified Assessment:   1. Arthritis of midfoot       Plan:  Patient was evaluated and treated and all questions answered.  Left dorsal midfoot arthritis -I clinically improved with a steroid injection.  At this time, clinically her pain is much more controlled and tolerable.  I will hold off on steroid injection.  At this time I discussed with her we can continue maintain a steroid injection as needed.  If she starts having flareup I have asked her to come see me right away.  She states understanding -Continue using Voltaren gel over-the-counter.  No follow-ups on file.

## 2021-03-02 ENCOUNTER — Other Ambulatory Visit: Payer: Self-pay

## 2021-03-02 ENCOUNTER — Ambulatory Visit (INDEPENDENT_AMBULATORY_CARE_PROVIDER_SITE_OTHER): Payer: Medicare Other | Admitting: Podiatry

## 2021-03-02 DIAGNOSIS — M19079 Primary osteoarthritis, unspecified ankle and foot: Secondary | ICD-10-CM

## 2021-03-02 DIAGNOSIS — S90121A Contusion of right lesser toe(s) without damage to nail, initial encounter: Secondary | ICD-10-CM | POA: Diagnosis not present

## 2021-03-02 DIAGNOSIS — M19071 Primary osteoarthritis, right ankle and foot: Secondary | ICD-10-CM

## 2021-03-02 NOTE — Progress Notes (Signed)
?Subjective:  ?Patient ID: Kathy Morgan, female    DOB: [birth year],  MRN: [id number] ? ?Chief Complaint  ?Patient presents with  ? Injections  ?  Left foot injection   ? ? ?87 y.o. female presents with the above complaint.  Patient presents for follow-up of left dorsal midfoot pain/arthritic flareup.  She states the injection helps.  She is here for another injection.  She also has secondary complaint of right second toe contusion.  She states he stubbed it against a wall while she was walking.  She is it was very light.  Her pain is essentially gone but she just wanted to make sure everything was good with the nail. ? ?Review of Systems: Negative except as noted in the HPI. Denies N/V/F/Ch. ? ?Past Medical History:  ?Diagnosis Date  ? Arthritis   ? lower back  ? Hyperlipidemia   ? Hypertension   ? Vertigo   ? Wears dentures   ? full upper  ? ? ?Current Outpatient Medications:  ?  amLODipine (NORVASC) 5 MG tablet, Take 1 tablet (5 mg total) by mouth daily., Disp: 90 tablet, Rfl: 3 ?  atorvastatin (LIPITOR) 10 MG tablet, Take 1 tablet (10 mg total) by mouth daily., Disp: 90 tablet, Rfl: 3 ?  cholecalciferol (VITAMIN D) 400 UNITS TABS tablet, Take 1,000 Units by mouth., Disp: , Rfl:  ?  metoprolol succinate (TOPROL-XL) 100 MG 24 hr tablet, TAKE 1 TABLET DAILY WITH ORIMMEDIATELY FOLLOWING A    MEAL, Disp: 90 tablet, Rfl: 3 ?  metoprolol tartrate (LOPRESSOR) 100 MG tablet, TAKE 1 TABLET DAILY., Disp: 90 tablet, Rfl: 3 ? ?Social History  ? ?Tobacco Use  ?Smoking Status Never  ?Smokeless Tobacco Never  ? ? ?No Known Allergies ?Objective:  ?There were no vitals filed for this visit. ?There is no height or weight on file to calculate BMI. ?Constitutional Well developed. ?Well nourished.  ?Vascular Dorsalis pedis pulses palpable bilaterally. ?Posterior tibial pulses palpable bilaterally. ?Capillary refill normal to all digits.  ?No cyanosis or clubbing noted. ?Pedal hair growth normal.  ?Neurologic Normal speech. ?Oriented  to person, place, and time. ?Epicritic sensation to light touch grossly present bilaterally.  ?Dermatologic Nails well groomed and normal in appearance. ?No open wounds. ?No skin lesions.  ?Orthopedic: Pain on palpation to the left dorsal midfoot.  Pain at the third fourth and fifth tarsometatarsal joint.  Pain with range of motion of the joints.  No pain at the metatarsophalangeal joints.  No extensor or flexor tendinitis noted.  ? ?Radiographs: 3 views of skeletally mature adult left foot: Osteoarthritic changes noted of the left first metatarsophalangeal joint severe in nature.  Severe osteoarthritic changes noted of the midfoot joints as well.  Flatfoot foot structure noted.  No other bony abnormalities identified ?Assessment:  ? ?1. Contusion of second toe of right foot, initial encounter   ?2. Arthritis of midfoot   ? ? ? ? ?Plan:  ?Patient was evaluated and treated and all questions answered. ? ?Left dorsal midfoot arthritis ?-I explained the patient the etiology of arthritis and various treatment options were discussed.  Given the amount of pain she is having I believe she will benefit from a steroid injection to help decrease acute inflammatory component associated pain.  Patient agrees with plan like to proceed with a steroid injection. ?-Another steroid injection was performed at left dorsal midfoot at point of maximal tenderness using 1% plain Lidocaine and 10 mg of Kenalog. This was well tolerated. ?-Continue using Voltaren gel over-the-counter. ? ?  Right second mild toe contusion  ?-I explained to the patient the etiology of toe contusion versus treatment options were discussed.  I do not see any ecchymosis or bruising.  The nail appears to be well adhered to the underlying nailbed.  I discussed with the patient that in the future the nail may fall off if there is too much contusion.  Only time will tell.  She states understanding. ?-At this time I discussed that she can do buddy splinting to can help  and allow the soft tissue to heal however her pain is no longer there and this injury happened few weeks ago.  She states she will do her best. ? ?No follow-ups on file.  ? ?

## 2021-03-20 ENCOUNTER — Other Ambulatory Visit: Payer: Self-pay

## 2021-03-20 ENCOUNTER — Emergency Department
Admission: EM | Admit: 2021-03-20 | Discharge: 2021-03-20 | Disposition: A | Discharge status: Home / Self Care | Payer: Medicare Other | Attending: Emergency Medicine | Admitting: Emergency Medicine

## 2021-03-20 DIAGNOSIS — I10 Essential (primary) hypertension: Secondary | ICD-10-CM | POA: Insufficient documentation

## 2021-03-20 DIAGNOSIS — E876 Hypokalemia: Secondary | ICD-10-CM | POA: Diagnosis not present

## 2021-03-20 DIAGNOSIS — Z8616 Personal history of COVID-19: Secondary | ICD-10-CM | POA: Diagnosis not present

## 2021-03-20 DIAGNOSIS — R55 Syncope and collapse: Secondary | ICD-10-CM | POA: Diagnosis present

## 2021-03-20 DIAGNOSIS — Z20822 Contact with and (suspected) exposure to covid-19: Secondary | ICD-10-CM | POA: Diagnosis not present

## 2021-03-20 LAB — URINALYSIS, ROUTINE W REFLEX MICROSCOPIC
Bilirubin Urine: NEGATIVE
Glucose, UA: NEGATIVE mg/dL
Hgb urine dipstick: NEGATIVE
Ketones, ur: NEGATIVE mg/dL
Leukocytes,Ua: NEGATIVE
Nitrite: NEGATIVE
Protein, ur: NEGATIVE mg/dL
Specific Gravity, Urine: 1.009 (ref 1.005–1.030)
pH: 7 (ref 5.0–8.0)

## 2021-03-20 LAB — CBC
HCT: 43.2 % (ref 36.0–46.0)
Hemoglobin: 13.9 g/dL (ref 12.0–15.0)
MCH: 30.7 pg (ref 26.0–34.0)
MCHC: 32.2 g/dL (ref 30.0–36.0)
MCV: 95.4 fL (ref 80.0–100.0)
Platelets: 166 10*3/uL (ref 150–400)
RBC: 4.53 MIL/uL (ref 3.87–5.11)
RDW: 14.1 % (ref 11.5–15.5)
WBC: 7.4 10*3/uL (ref 4.0–10.5)
nRBC: 0 % (ref 0.0–0.2)

## 2021-03-20 LAB — BASIC METABOLIC PANEL
Anion gap: 11 (ref 5–15)
BUN: 18 mg/dL (ref 8–23)
CO2: 27 mmol/L (ref 22–32)
Calcium: 9.2 mg/dL (ref 8.9–10.3)
Chloride: 103 mmol/L (ref 98–111)
Creatinine, Ser: 0.85 mg/dL (ref 0.44–1.00)
GFR, Estimated: >60 mL/min (ref >60)
Glucose, Bld: 129 mg/dL — ABNORMAL HIGH (ref 70–99)
Potassium: 3.4 mmol/L — ABNORMAL LOW (ref 3.5–5.1)
Sodium: 141 mmol/L (ref 135–145)

## 2021-03-20 LAB — RESP PANEL BY RT-PCR (FLU A&B, COVID) ARPGX2
Influenza A by PCR: NEGATIVE
Influenza B by PCR: NEGATIVE
SARS Coronavirus 2 by RT PCR: NEGATIVE

## 2021-03-20 NOTE — Discharge Instructions (Signed)
Your blood work and urine sample were all reassuring today.  If you develop any new symptoms that are concerning to you such as cough fevers chest pain or shortness of breath, please do not hesitate to return to the emergency department.  Please try to make sure you are eating a small meal in the morning which may have been why you felt faint today. ?

## 2021-03-20 NOTE — ED Provider Notes (Signed)
? ?[facility] ?Provider Note ? ? ? Event Date/Time  ? First MD Initiated Contact with Patient 03/20/21 1527   ?  (approximate) ? ? ?History  ? ?Near Syncope ? ? ?HPI ? ?Kathy Morgan is a 87 y.o. female with past medical history of hypertension, hyperlipidemia and arthritis who presents with chills and feeling lightheaded today.  Patient notes that over the last several days she has had intermittent chills however she denies any measured fever at home.  Denies nausea vomiting abdominal pain dysuria headache body aches.  Denies cough congestion or shortness of breath.  Then while she was in the grocery store [employer] today waiting in line for a sandwich she felt somewhat faint like she was going to pass out.  There is no preceding palpitations shortness of breath or chest pain.  She felt improved upon sitting down.  Of note patient had not eaten anything yet this morning.  She had a similar episode of feeling faint last week which resolved without issue. ?  ? ?Past Medical History:  ?Diagnosis Date  ? Arthritis   ? lower back  ? Hyperlipidemia   ? Hypertension   ? Vertigo   ? Wears dentures   ? full upper  ? ? ?Patient Active Problem List  ? Diagnosis Date Noted  ? Hallux valgus of right foot 07/12/2020  ? 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 Neck muscle spasm 02/26/2020  ? Sprain of right rotator cuff capsule 09/09/2019  ? Vertigo 09/09/2019  ? Hypertension   ? Hyperlipidemia   ? ? ? ?Physical Exam  ?Triage Vital Signs: ?ED Triage Vitals [03/20/21 1453]  ?Enc Vitals Group  ?   BP 90/62  ?   Pulse Rate 83  ?   Resp 18  ?   Temp 98.2 ?F (36.8 ?C)  ?   Temp Source Oral  ?   SpO2 98 %  ?   Weight   ?   Height 5\' 1"  (1.549 m)  ?   Head Circumference   ?   Peak Flow   ?   Pain Score 0  ?   Pain Loc   ?   Pain Edu?   ?   Excl. in GC?   ? ? ?Most recent vital signs: ?Vitals:  ? 03/20/21 1453 03/20/21 1556  ?BP: 90/62 (!) 168/80  ?Pulse: 83   ?Resp: 18   ?Temp: 98.2 ?F (36.8 ?C)   ?SpO2: 98%    ? ? ? ?General: Awake, no distress.  ?CV:  Good peripheral perfusion.  ?Resp:  Normal effort. Lungs are clear ?Abd:  No distention. \ontender throughout ?Neuro:             Awake, Alert, Oriented x 3  ?Other:   ? ? ?ED Results / Procedures / Treatments  ?Labs ?(all labs ordered are listed, but only abnormal results are displayed) ?Labs Reviewed  ?BASIC METABOLIC PANEL - Abnormal; Notable for the following components:  ?    Result Value  ? Potassium 3.4 (*)   ? Glucose, Bld 129 (*)   ? All other components within normal limits  ?URINALYSIS, ROUTINE W REFLEX MICROSCOPIC - Abnormal; Notable for the following components:  ? Color, Urine STRAW (*)   ? APPearance CLEAR (*)   ? All other components within normal limits  ?RESP PANEL BY RT-PCR (FLU A&B, COVID) [id number]  ?CBC  ?CBG MONITORING, ED  ? ? ? ?EKG ?EKG reviewed by myself, normal sinus rhythm, normal axis normal  intervals nonspecific T wave changes throughout similar to prior EKG ? ? ? ?RADIOLOGY ? ? ? ?PROCEDURES: ? ?Critical Care performed: No ? ?.1-3 Lead EKG Interpretation ?Performed by: Georga Hacking, MD ?Authorized by: Georga Hacking, MD  ? ?  Interpretation: normal   ?  ECG rate assessment: normal   ?  Ectopy: none   ?  Conduction: normal   ? ?The patient is on the cardiac monitor to evaluate for evidence of arrhythmia and/or significant heart rate changes. ? ? ?MEDICATIONS ORDERED IN ED: ?Medications - No data to display ? ? ?IMPRESSION / MDM / ASSESSMENT AND PLAN / ED COURSE  ?I reviewed the triage vital signs and the nursing notes. ?             ?               ? ?Differential diagnosis includes, but is not limited to, orthostatic presyncope, vasovagal, hypoglycemia, electrolyte abnormality, UTI, viral illness ? ?Patient is a 87 year old female who is rather healthy who presents with chills for several days as well as lightheadedness while standing in line today.  Patient currently has no focal symptoms of infection including no cough congestion  shortness of breath fever abdominal pain nausea vomiting or dysuria.  Initial blood pressure is on the low side here 90/60 however on repeat before intervention it is 168/80 and she has baseline hypertensives this is probably more similar to her baseline.  Patient appears quite well especially given her age.  Her abdomen is benign lungs are clear.  Reviewed her labs which are negative for any leukocytosis or anemia.  Potassium mildly low at 3.4.  Reviewed her EKG which has some nonspecific ST changes however this is similar to prior EKG and patient not having any symptoms of ischemia.  Ultimately I am reassured by her work-up here and appearance clinically.  We will send a UA to rule out UTI as well as COVID and influenza testing.  Her clinical presentation does not suggest any other occult source of bacterial infection.  Suspect she may have been somewhat hypoglycemic as she had not eaten today and was standing in line which likely triggered her's presyncope. ? ?UA is negative.  COVID and influenza testing is pending at the time of discharge.  Given her stable vital signs clinical well appearance and reassuring blood work I think she is appropriate for discharge. ? ?  ? ? ?FINAL CLINICAL IMPRESSION(S) / ED DIAGNOSES  ? ?Final diagnoses:  ?Near syncope  ? ? ? ?Rx / DC Orders  ? ?ED Discharge Orders   ? ? None  ? ?  ? ? ? ?Note:  This document was prepared using Dragon voice recognition software and may include unintentional dictation errors. ?  ?Georga Hacking, MD ?03/20/21 1651 ? ?

## 2021-03-20 NOTE — ED Triage Notes (Signed)
Patient to ER via POV with reports of chills and lightheadness that started today. Reports she was in the store today and felt like she might pass out, after walking around the store she started experiencing chills.  ? ?Patient states that now she just has chills. Has been feeling chills intermittently for the last several days.  ?

## 2021-03-22 ENCOUNTER — Ambulatory Visit (INDEPENDENT_AMBULATORY_CARE_PROVIDER_SITE_OTHER): Payer: Medicare Other | Admitting: Internal Medicine

## 2021-03-22 ENCOUNTER — Other Ambulatory Visit: Payer: Self-pay

## 2021-03-22 ENCOUNTER — Encounter: Payer: Self-pay | Admitting: Internal Medicine

## 2021-03-22 VITALS — BP 160/78 | HR 64 | Ht 61.0 in | Wt 134.5 lb

## 2021-03-22 DIAGNOSIS — I10 Essential (primary) hypertension: Secondary | ICD-10-CM

## 2021-03-22 DIAGNOSIS — R42 Dizziness and giddiness: Secondary | ICD-10-CM | POA: Diagnosis not present

## 2021-03-22 DIAGNOSIS — E7800 Pure hypercholesterolemia, unspecified: Secondary | ICD-10-CM | POA: Diagnosis not present

## 2021-03-22 MED ORDER — METOPROLOL SUCCINATE ER 50 MG PO TB24: 50.0000 mg | ORAL_TABLET | Freq: Every day | ORAL | 3 refills | Status: DC

## 2021-03-22 NOTE — Assessment & Plan Note (Signed)
Hypercholesterolemia  I advised the patient to follow Mediterranean diet This diet is rich in fruits vegetables and whole grain, and This diet is also rich in fish and lean meat Patient should also eat a handful of almonds or walnuts daily Recent heart study indicated that average follow-up on this kind of diet reduces the cardiovascular mortality by 50 to 70%== 

## 2021-03-22 NOTE — Assessment & Plan Note (Signed)
Blood pressure is stable no evidence of postural hypotension ?

## 2021-03-22 NOTE — Assessment & Plan Note (Signed)
We will reduce metoprolol to 50 mg p.o. daily, get a Holter monitor as soon as possible ?

## 2021-03-22 NOTE — Progress Notes (Signed)
? ?Established Patient Office Visit ? ?Subjective:  ?Patient ID: Kathy Morgan, female    DOB: 1934/04/06  Age: 87 y.o. MRN: [id number] ? ?CC:  ?Chief Complaint  ?Patient presents with  ? Follow-up  ?  Patient is here for an Emergency room follow up on dizziness.   ?Patient is known to have hypertension dyslipidemia arthritis she has a history of passing out spell in the grocery store and then seen in the emergency room.  She denies any history of chest pain COVID test was negative.  She denies any history of trouble passing water headache and previous passing out spell.  She had a surgery for the cataract recently.  I checked in the office for orthostatic hypotension and she was not found to have any carotid bruit.  she is not sick with any kind of viral illness or UTI.  I will get a 24-hour Holter monitor ? ? ? ?Kathy Morgan presents for dizziness ? ?Past Medical History:  ?Diagnosis Date  ? Arthritis   ? lower back  ? Hyperlipidemia   ? Hypertension   ? Vertigo   ? Wears dentures   ? full upper  ? ? ?Past Surgical History:  ?Procedure Laterality Date  ? BREAST SURGERY Right 2011  ? R cyst removal (benign)   ? CATARACT EXTRACTION W/PHACO Right 01/05/2019  ? Procedure: CATARACT EXTRACTION PHACO AND INTRAOCULAR LENS PLACEMENT (IOC) RIGHT 2.88  00:29.7;  Surgeon: Eulogio Bear, MD;  Location: [facility];  Service: Ophthalmology;  Laterality: Right;  ? CATARACT EXTRACTION W/PHACO Left 02/23/2019  ? Procedure: CATARACT EXTRACTION PHACO AND INTRAOCULAR LENS PLACEMENT (IOC) LEFT 4.68  00:41.3;  Surgeon: Eulogio Bear, MD;  Location: [facility];  Service: Ophthalmology;  Laterality: Left;  needs to stay 2nd  ? VAGINAL HYSTERECTOMY  1991  ? ? ?Family History  ?Problem Relation Age of Onset  ? Cancer Mother   ? Dementia Father   ? ? ?Social History  ? ?Socioeconomic History  ? Marital status: Widowed  ?  Spouse name: Not on file  ? Number of children: Not on file  ? Years of education: Not on file   ? Highest education level: Not on file  ?Occupational History  ? Not on file  ?Tobacco Use  ? Smoking status: Never  ? Smokeless tobacco: Never  ?Vaping Use  ? Vaping Use: Never used  ?Substance and Sexual Activity  ? Alcohol use: Yes  ?  Alcohol/week: 2.0 standard drinks  ?  Types: 2 Glasses of wine per week  ? Drug use: No  ? Sexual activity: Never  ?Other Topics Concern  ? Not on file  ?Social History Narrative  ? Not on file  ? ?Social Determinants of Health  ? ?Financial Resource Strain: Low Risk   ? Difficulty of Paying Living Expenses: Not hard at all  ?Food Insecurity: No Food Insecurity  ? Worried About Charity fundraiser in the Last Year: Never true  ? Ran Out of Food in the Last Year: Never true  ?Transportation Needs: No Transportation Needs  ? Lack of Transportation (Medical): No  ? Lack of Transportation (Non-Medical): No  ?Physical Activity: Insufficiently Active  ? Days of Exercise per Week: 2 days  ? Minutes of Exercise per Session: 20 min  ?Stress: No Stress Concern Present  ? Feeling of Stress : Not at all  ?Social Connections: Moderately Integrated  ? Frequency of Communication with Friends and Family: More than three times a week  ?  Frequency of Social Gatherings with Friends and Family: More than three times a week  ? Attends Religious Services: More than 4 times per year  ? Active Member of Clubs or Organizations: Yes  ? Attends Archivist Meetings: More than 4 times per year  ? Marital Status: Widowed  ?Intimate Partner Violence: Not At Risk  ? Fear of Current or Ex-Partner: No  ? Emotionally Abused: No  ? Physically Abused: No  ? Sexually Abused: No  ? ? ? ?Current Outpatient Medications:  ?  amLODipine (NORVASC) 5 MG tablet, Take 1 tablet (5 mg total) by mouth daily., Disp: 90 tablet, Rfl: 3 ?  atorvastatin (LIPITOR) 10 MG tablet, Take 1 tablet (10 mg total) by mouth daily., Disp: 90 tablet, Rfl: 3 ?  cholecalciferol (VITAMIN D) 400 UNITS TABS tablet, Take 1,000 Units by mouth  daily., Disp: , Rfl:  ?  metoprolol tartrate (LOPRESSOR) 100 MG tablet, TAKE 1 TABLET DAILY., Disp: 90 tablet, Rfl: 3  ? ?No Known Allergies ? ?ROS ?Review of Systems  ?Constitutional: Negative.   ?HENT: Negative.    ?Eyes: Negative.   ?Respiratory: Negative.    ?Cardiovascular: Negative.   ?Gastrointestinal: Negative.   ?Endocrine: Negative.   ?Genitourinary: Negative.   ?Musculoskeletal: Negative.   ?Skin: Negative.   ?Allergic/Immunologic: Negative.   ?Neurological:  Positive for dizziness.  ?Hematological: Negative.   ?Psychiatric/Behavioral: Negative.    ?All other systems reviewed and are negative. ? ?  ?Objective:  ?  ?Physical Exam ?Vitals reviewed.  ?Constitutional:   ?   Appearance: Normal appearance.  ?HENT:  ?   Mouth/Throat:  ?   Mouth: Mucous membranes are moist.  ?Eyes:  ?   Pupils: Pupils are equal, round, and reactive to light.  ?Neck:  ?   Vascular: No carotid bruit.  ?Cardiovascular:  ?   Rate and Rhythm: Normal rate and regular rhythm.  ?   Pulses: Normal pulses.  ?   Heart sounds: Normal heart sounds.  ?Pulmonary:  ?   Effort: Pulmonary effort is normal.  ?   Breath sounds: Normal breath sounds.  ?Abdominal:  ?   General: Bowel sounds are normal.  ?   Palpations: Abdomen is soft. There is no hepatomegaly, splenomegaly or mass.  ?   Tenderness: There is no abdominal tenderness.  ?   Hernia: No hernia is present.  ?Musculoskeletal:     ?   General: No tenderness.  ?   Cervical back: Neck supple.  ?   Right lower leg: No edema.  ?   Left lower leg: No edema.  ?Skin: ?   Findings: No rash.  ?Neurological:  ?   Mental Status: She is alert and oriented to person, place, and time.  ?   Motor: No weakness.  ?Psychiatric:     ?   Mood and Affect: Mood and affect normal.     ?   Behavior: Behavior normal.  ? ? ?BP (!) 160/78   Pulse 64   Ht _0  (1.549 m)   Wt 134 lb 8 oz (61 kg)   BMI 25.41 kg/m?  ?Wt Readings from Last 3 Encounters:  ?03/22/21 134 lb 8 oz (61 kg)  ?01/18/21 135 lb 1.6 oz (61.3 kg)   ?09/07/20 138 lb (62.6 kg)  ? ? ? ?Health Maintenance Due  ?Topic Date Due  ? TETANUS/TDAP  Never done  ? DEXA SCAN  Never done  ? Zoster Vaccines- Shingrix (2 of 2) 07/30/2013  ? Pneumonia Vaccine  54+ Years old (2 - PCV) 09/10/2015  ? COVID-19 Vaccine (4 - Booster for Pfizer series) 02/12/2020  ? INFLUENZA VACCINE  08/01/2020  ? ? ?There are no preventive care reminders to display for this patient. ? ?Lab Results  ?Component Value Date  ? TSH 2.60 01/10/2021  ? ?Lab Results  ?Component Value Date  ? WBC 7.4 03/20/2021  ? HGB 13.9 03/20/2021  ? HCT 43.2 03/20/2021  ? MCV 95.4 03/20/2021  ? PLT 166 03/20/2021  ? ?Lab Results  ?Component Value Date  ? NA 141 03/20/2021  ? K 3.4 (L) 03/20/2021  ? CO2 27 03/20/2021  ? GLUCOSE 129 (H) 03/20/2021  ? BUN 18 03/20/2021  ? CREATININE 0.85 03/20/2021  ? BILITOT 0.9 01/10/2021  ? ALKPHOS 72 07/02/2020  ? AST 24 01/10/2021  ? ALT 16 01/10/2021  ? PROT 7.6 01/10/2021  ? ALBUMIN 4.5 07/02/2020  ? CALCIUM 9.2 03/20/2021  ? ANIONGAP 11 03/20/2021  ? EGFR 73 01/10/2021  ? ?Lab Results  ?Component Value Date  ? CHOL 183 01/10/2021  ? ?Lab Results  ?Component Value Date  ? HDL 75 01/10/2021  ? ?Lab Results  ?Component Value Date  ? [place] 93 01/10/2021  ? ?Lab Results  ?Component Value Date  ? TRIG 61 01/10/2021  ? ?Lab Results  ?Component Value Date  ? CHOLHDL 2.4 01/10/2021  ? ?No results found for: HGBA1C ? ?  ?Assessment & Plan:  ? ?Problem List Items Addressed This Visit   ? ?  ? Cardiovascular and Mediastinum  ? Hypertension - Primary  ?  Blood pressure is stable no evidence of postural hypotension ?  ?  ?  ? Other  ? Hyperlipidemia  ?  Hypercholesterolemia ? ?I advised the patient to follow Mediterranean diet ?This diet is rich in fruits vegetables and whole grain, and ?This diet is also rich in fish and lean meat ?Patient should also eat a handful of almonds or walnuts daily ?Recent heart study indicated that average follow-up on this kind of diet reduces the cardiovascular  mortality by 50 to 70%== ?  ?  ? Vertigo  ?  We will reduce metoprolol to 50 mg p.o. daily, get a Holter monitor as soon as possible ?  ?  ? ? ?No orders of the defined types were placed in this encounter

## 2021-04-18 ENCOUNTER — Ambulatory Visit: Payer: Medicare Other | Admitting: Internal Medicine

## 2021-04-19 ENCOUNTER — Encounter: Payer: Self-pay | Admitting: Internal Medicine

## 2021-04-19 ENCOUNTER — Ambulatory Visit (INDEPENDENT_AMBULATORY_CARE_PROVIDER_SITE_OTHER): Payer: Medicare Other | Admitting: Internal Medicine

## 2021-04-19 VITALS — BP 140/74 | HR 83 | Ht 61.0 in | Wt 134.0 lb

## 2021-04-19 DIAGNOSIS — R42 Dizziness and giddiness: Secondary | ICD-10-CM | POA: Diagnosis not present

## 2021-04-19 DIAGNOSIS — E7800 Pure hypercholesterolemia, unspecified: Secondary | ICD-10-CM

## 2021-04-19 DIAGNOSIS — I10 Essential (primary) hypertension: Secondary | ICD-10-CM

## 2021-04-19 NOTE — Assessment & Plan Note (Signed)
Stable now ?No dizziness or syncope ?Holter did not show any significant arrhythmia ?

## 2021-04-19 NOTE — Progress Notes (Signed)
? ?Established Patient Office Visit ? ?Subjective:  ?Patient ID: Kathy Morgan, female    DOB: 1934/05/09  Age: 87 y.o. MRN: [id number] ? ?CC:  ?Chief Complaint  ?Patient presents with  ? Holter Report Results  ? ? ?HPI ? ?Kathy Morgan presents for check up, patient has a Holter monitor which revealed sinus tachycardia.  Episode of sinus bradycardia no episode of fluttering of the heart a few beats ? ?Past Medical History:  ?Diagnosis Date  ? Arthritis   ? lower back  ? Hyperlipidemia   ? Hypertension   ? Vertigo   ? Wears dentures   ? full upper  ? ? ?Past Surgical History:  ?Procedure Laterality Date  ? BREAST SURGERY Right 2011  ? R cyst removal (benign)   ? CATARACT EXTRACTION W/PHACO Right 01/05/2019  ? Procedure: CATARACT EXTRACTION PHACO AND INTRAOCULAR LENS PLACEMENT (IOC) RIGHT 2.88  00:29.7;  Surgeon: Eulogio Bear, MD;  Location: [facility];  Service: Ophthalmology;  Laterality: Right;  ? CATARACT EXTRACTION W/PHACO Left 02/23/2019  ? Procedure: CATARACT EXTRACTION PHACO AND INTRAOCULAR LENS PLACEMENT (IOC) LEFT 4.68  00:41.3;  Surgeon: Eulogio Bear, MD;  Location: [place];  Service: Ophthalmology;  Laterality: Left;  needs to stay 2nd  ? VAGINAL HYSTERECTOMY  1991  ? ? ?Family History  ?Problem Relation Age of Onset  ? Cancer Mother   ? Dementia Father   ? ? ?Social History  ? ?Socioeconomic History  ? Marital status: Widowed  ?  Spouse name: Not on file  ? Number of children: Not on file  ? Years of education: Not on file  ? Highest education level: Not on file  ?Occupational History  ? Not on file  ?Tobacco Use  ? Smoking status: Never  ? Smokeless tobacco: Never  ?Vaping Use  ? Vaping Use: Never used  ?Substance and Sexual Activity  ? Alcohol use: Yes  ?  Alcohol/week: 2.0 standard drinks  ?  Types: 2 Glasses of wine per week  ? Drug use: No  ? Sexual activity: Never  ?Other Topics Concern  ? Not on file  ?Social History Narrative  ? Not on file  ? ?Social Determinants  of Health  ? ?Financial Resource Strain: Low Risk   ? Difficulty of Paying Living Expenses: Not hard at all  ?Food Insecurity: No Food Insecurity  ? Worried About Charity fundraiser in the Last Year: Never true  ? Ran Out of Food in the Last Year: Never true  ?Transportation Needs: No Transportation Needs  ? Lack of Transportation (Medical): No  ? Lack of Transportation (Non-Medical): No  ?Physical Activity: Insufficiently Active  ? Days of Exercise per Week: 2 days  ? Minutes of Exercise per Session: 20 min  ?Stress: No Stress Concern Present  ? Feeling of Stress : Not at all  ?Social Connections: Moderately Integrated  ? Frequency of Communication with Friends and Family: More than three times a week  ? Frequency of Social Gatherings with Friends and Family: More than three times a week  ? Attends Religious Services: More than 4 times per year  ? Active Member of Clubs or Organizations: Yes  ? Attends Archivist Meetings: More than 4 times per year  ? Marital Status: Widowed  ?Intimate Partner Violence: Not At Risk  ? Fear of Current or Ex-Partner: No  ? Emotionally Abused: No  ? Physically Abused: No  ? Sexually Abused: No  ? ? ? ?Current Outpatient Medications:  ?  amLODipine (NORVASC) 5 MG tablet, Take 1 tablet (5 mg total) by mouth daily., Disp: 90 tablet, Rfl: 3 ?  atorvastatin (LIPITOR) 10 MG tablet, Take 1 tablet (10 mg total) by mouth daily., Disp: 90 tablet, Rfl: 3 ?  cholecalciferol (VITAMIN D) 400 UNITS TABS tablet, Take 1,000 Units by mouth daily., Disp: , Rfl:  ?  metoprolol succinate (TOPROL-XL) 50 MG 24 hr tablet, Take 1 tablet (50 mg total) by mouth daily. Take with or immediately following a meal., Disp: 90 tablet, Rfl: 3  ? ?No Known Allergies ? ?ROS ?Review of Systems  ?Constitutional: Negative.   ?HENT: Negative.    ?Eyes: Negative.   ?Respiratory: Negative.    ?Cardiovascular: Negative.   ?Gastrointestinal: Negative.   ?Endocrine: Negative.   ?Genitourinary: Negative.    ?Musculoskeletal: Negative.   ?Skin: Negative.   ?Allergic/Immunologic: Negative.   ?Neurological: Negative.   ?Hematological: Negative.   ?Psychiatric/Behavioral: Negative.    ?All other systems reviewed and are negative. ? ?  ?Objective:  ?  ?Physical Exam ?Vitals reviewed.  ?Constitutional:   ?   Appearance: Normal appearance.  ?HENT:  ?   Mouth/Throat:  ?   Mouth: Mucous membranes are moist.  ?Eyes:  ?   Pupils: Pupils are equal, round, and reactive to light.  ?Neck:  ?   Vascular: No carotid bruit.  ?Cardiovascular:  ?   Rate and Rhythm: Normal rate and regular rhythm.  ?   Pulses: Normal pulses.  ?   Heart sounds: Normal heart sounds.  ?Pulmonary:  ?   Effort: Pulmonary effort is normal.  ?   Breath sounds: Normal breath sounds.  ?Abdominal:  ?   General: Bowel sounds are normal.  ?   Palpations: Abdomen is soft. There is no hepatomegaly, splenomegaly or mass.  ?   Tenderness: There is no abdominal tenderness.  ?   Hernia: No hernia is present.  ?Musculoskeletal:     ?   General: No tenderness.  ?   Cervical back: Neck supple.  ?   Right lower leg: No edema.  ?   Left lower leg: No edema.  ?Skin: ?   Findings: No rash.  ?Neurological:  ?   Mental Status: She is alert and oriented to person, place, and time.  ?   Motor: No weakness.  ?Psychiatric:     ?   Mood and Affect: Mood and affect normal.     ?   Behavior: Behavior normal.  ? ? ?BP 140/74   Pulse 83   Ht '5\' 1"'  (1.549 m)   Wt 134 lb (60.8 kg)   BMI 25.32 kg/m?  ?Wt Readings from Last 3 Encounters:  ?04/19/21 134 lb (60.8 kg)  ?03/22/21 134 lb 8 oz (61 kg)  ?01/18/21 135 lb 1.6 oz (61.3 kg)  ? ? ? ?Health Maintenance Due  ?Topic Date Due  ? TETANUS/TDAP  Never done  ? DEXA SCAN  Never done  ? Zoster Vaccines- Shingrix (2 of 2) 07/30/2013  ? Pneumonia Vaccine 22+ Years old (2 - PCV) 09/10/2015  ? COVID-19 Vaccine (4 - Booster for Pfizer series) 02/12/2020  ?Report of the Holter monitor. ?Holter monitor reveals sinus tachycardia with occ sinus  bradycardia rate of 50 supraventricular tachycardia rate 145   ?PVCS WERE NOTED.  short RUN OF ATRIAL FLUTTER   ?Average HEART RATE IS 66 MINIMUM HEART RATE IS 51 ? ?There are no preventive care reminders to display for this patient. ? ?Lab Results  ?Component Value Date  ?  TSH 2.60 01/10/2021  ? ?Lab Results  ?Component Value Date  ? WBC 7.4 03/20/2021  ? HGB 13.9 03/20/2021  ? HCT 43.2 03/20/2021  ? MCV 95.4 03/20/2021  ? PLT 166 03/20/2021  ? ?Lab Results  ?Component Value Date  ? NA 141 03/20/2021  ? K 3.4 (L) 03/20/2021  ? CO2 27 03/20/2021  ? GLUCOSE 129 (H) 03/20/2021  ? BUN 18 03/20/2021  ? CREATININE 0.85 03/20/2021  ? BILITOT 0.9 01/10/2021  ? ALKPHOS 72 07/02/2020  ? AST 24 01/10/2021  ? ALT 16 01/10/2021  ? PROT 7.6 01/10/2021  ? ALBUMIN 4.5 07/02/2020  ? CALCIUM 9.2 03/20/2021  ? ANIONGAP 11 03/20/2021  ? EGFR 73 01/10/2021  ? ?Lab Results  ?Component Value Date  ? CHOL 183 01/10/2021  ? ?Lab Results  ?Component Value Date  ? HDL 75 01/10/2021  ? ?Lab Results  ?Component Value Date  ? [place] 93 01/10/2021  ? ?Lab Results  ?Component Value Date  ? TRIG 61 01/10/2021  ? ?Lab Results  ?Component Value Date  ? CHOLHDL 2.4 01/10/2021  ? ?No results found for: HGBA1C ? ?  ?Assessment & Plan:  ? ?Problem List Items Addressed This Visit   ? ?  ? Cardiovascular and Mediastinum  ? Hypertension - Primary  ?   Patient denies any chest pain or shortness of breath there is no history of palpitation or paroxysmal nocturnal dyspnea ?  patient was advised to follow low-salt low-cholesterol diet ? ?  ideally I want to keep systolic blood pressure below 130 mmHg, patient was asked to check blood pressure one times a week and give me a report on that.  Patient will be follow-up in 3 months  or earlier as needed, patient will call me back for any change in the cardiovascular symptoms ?Patient was advised to buy a book from local bookstore concerning blood pressure and read several chapters  every day.  This will be  supplemented by some of the material we will give him from the office.  Patient should also utilize other resources like YouTube and Internet to learn more about the blood pressure and the diet. ? ?  ?  ?  ? Other  ? Hyperlipidemia  ?

## 2021-04-19 NOTE — Assessment & Plan Note (Signed)

## 2021-04-19 NOTE — Assessment & Plan Note (Signed)
Hypercholesterolemia  I advised the patient to follow Mediterranean diet This diet is rich in fruits vegetables and whole grain, and This diet is also rich in fish and lean meat Patient should also eat a handful of almonds or walnuts daily Recent heart study indicated that average follow-up on this kind of diet reduces the cardiovascular mortality by 50 to 70%== 

## 2021-05-15 ENCOUNTER — Other Ambulatory Visit: Payer: Self-pay

## 2021-06-28 ENCOUNTER — Other Ambulatory Visit: Payer: Self-pay | Admitting: Internal Medicine

## 2021-07-19 ENCOUNTER — Ambulatory Visit (INDEPENDENT_AMBULATORY_CARE_PROVIDER_SITE_OTHER): Payer: Medicare Other | Admitting: Internal Medicine

## 2021-07-19 ENCOUNTER — Encounter: Payer: Self-pay | Admitting: Internal Medicine

## 2021-07-19 VITALS — BP 132/74 | HR 88 | Ht 61.0 in | Wt 130.9 lb

## 2021-07-19 DIAGNOSIS — I10 Essential (primary) hypertension: Secondary | ICD-10-CM

## 2021-07-19 DIAGNOSIS — E876 Hypokalemia: Secondary | ICD-10-CM | POA: Diagnosis not present

## 2021-07-19 DIAGNOSIS — S43421D Sprain of right rotator cuff capsule, subsequent encounter: Secondary | ICD-10-CM

## 2021-07-19 DIAGNOSIS — E7800 Pure hypercholesterolemia, unspecified: Secondary | ICD-10-CM | POA: Diagnosis not present

## 2021-07-19 MED ORDER — POTASSIUM CHLORIDE CRYS ER 20 MEQ PO TBCR: 20.0000 meq | EXTENDED_RELEASE_TABLET | Freq: Every day | ORAL | 3 refills | Status: DC

## 2021-07-19 NOTE — Assessment & Plan Note (Signed)
Suggest shoulder exercises ?

## 2021-07-19 NOTE — Progress Notes (Signed)
Established Patient Office Visit  Subjective:  Patient ID: Kathy Morgan, female    DOB: 01-17-1934  Age: 87 y.o. MRN: [id number]  CC:  Chief Complaint  Patient presents with   Hypertension    3 month bp follow up    Hypertension    Kathy Morgan presents for BP check  Past Medical History:  Diagnosis Date   Arthritis    lower back   Hyperlipidemia    Hypertension    Vertigo    Wears dentures    full upper    Past Surgical History:  Procedure Laterality Date   BREAST SURGERY Right 2011   R cyst removal (benign)    CATARACT EXTRACTION W/PHACO Right 01/05/2019   Procedure: CATARACT EXTRACTION PHACO AND INTRAOCULAR LENS PLACEMENT (IOC) RIGHT 2.88  00:29.7;  Surgeon: Eulogio Bear, MD;  Location: [facility];  Service: Ophthalmology;  Laterality: Right;   CATARACT EXTRACTION W/PHACO Left 02/23/2019   Procedure: CATARACT EXTRACTION PHACO AND INTRAOCULAR LENS PLACEMENT (IOC) LEFT 4.68  00:41.3;  Surgeon: Eulogio Bear, MD;  Location: [facility];  Service: Ophthalmology;  Laterality: Left;  needs to stay 2nd   [place]    Family History  Problem Relation Age of Onset   Cancer Mother    Dementia Father     Social History   Socioeconomic History   Marital status: Widowed    Spouse name: Not on file   Number of children: Not on file   Years of education: Not on file   Highest education level: Not on file  Occupational History   Not on file  Tobacco Use   Smoking status: Never   Smokeless tobacco: Never  Vaping Use   Vaping Use: Never used  Substance and Sexual Activity   Alcohol use: Yes    Alcohol/week: 2.0 standard drinks of alcohol    Types: 2 Glasses of wine per week   Drug use: No   Sexual activity: Never  Other Topics Concern   Not on file  Social History Narrative   Not on file   Social Determinants of Health   Financial Resource Strain: Low Risk  (09/02/2020)   Overall Financial Resource Strain  (CARDIA)    Difficulty of Paying Living Expenses: Not hard at all  Food Insecurity: No Food Insecurity (09/02/2020)   Hunger Vital Sign    Worried About Running Out of Food in the Last Year: Never true    Ran Out of Food in the Last Year: Never true  Transportation Needs: No Transportation Needs (09/02/2020)   PRAPARE - Hydrologist (Medical): No    Lack of Transportation (Non-Medical): No  Physical Activity: Insufficiently Active (09/02/2020)   Exercise Vital Sign    Days of Exercise per Week: 2 days    Minutes of Exercise per Session: 20 min  Stress: No Stress Concern Present (09/02/2020)   [place]    Feeling of Stress : Not at all  Social Connections: Moderately Integrated (09/02/2020)   Social Connection and Isolation Panel [NHANES]    Frequency of Communication with Friends and Family: More than three times a week    Frequency of Social Gatherings with Friends and Family: More than three times a week    Attends Religious Services: More than 4 times per year    Active Member of [employer] or Organizations: Yes    Attends Archivist Meetings: More than  4 times per year    Marital Status: Widowed  Intimate Partner Violence: Not At Risk (09/02/2020)   Humiliation, Afraid, Rape, and Kick questionnaire    Fear of Current or Ex-Partner: No    Emotionally Abused: No    Physically Abused: No    Sexually Abused: No     Current Outpatient Medications:    amLODipine (NORVASC) 5 MG tablet, Take 1 tablet (5 mg total) by mouth daily., Disp: 90 tablet, Rfl: 3   atorvastatin (LIPITOR) 10 MG tablet, TAKE 1 TABLET DAILY, Disp: 90 tablet, Rfl: 3   cholecalciferol (VITAMIN D) 400 UNITS TABS tablet, Take 1,000 Units by mouth daily., Disp: , Rfl:    metoprolol succinate (TOPROL-XL) 50 MG 24 hr tablet, Take 1 tablet (50 mg total) by mouth daily. Take with or immediately following a meal., Disp: 90 tablet,  Rfl: 3   No Known Allergies  ROS Review of Systems  Constitutional: Negative.   HENT: Negative.    Eyes: Negative.   Respiratory: Negative.    Cardiovascular: Negative.   Gastrointestinal: Negative.   Endocrine: Negative.   Genitourinary: Negative.   Musculoskeletal: Negative.   Skin: Negative.   Allergic/Immunologic: Negative.   Neurological: Negative.   Hematological: Negative.   Psychiatric/Behavioral: Negative.    All other systems reviewed and are negative.     Objective:    Physical Exam Vitals reviewed.  Constitutional:      Appearance: Normal appearance.  HENT:     Mouth/Throat:     Mouth: Mucous membranes are moist.  Eyes:     Pupils: Pupils are equal, round, and reactive to light.  Neck:     Vascular: No carotid bruit.  Cardiovascular:     Rate and Rhythm: Normal rate and regular rhythm.     Pulses: Normal pulses.     Heart sounds: Normal heart sounds.  Pulmonary:     Effort: Pulmonary effort is normal.     Breath sounds: Normal breath sounds.  Abdominal:     General: Bowel sounds are normal.     Palpations: Abdomen is soft. There is no hepatomegaly, splenomegaly or mass.     Tenderness: There is no abdominal tenderness.     Hernia: No hernia is present.  Musculoskeletal:        General: No tenderness.     Cervical back: Neck supple.     Right lower leg: No edema.     Left lower leg: No edema.  Skin:    Findings: No rash.  Neurological:     Mental Status: She is alert and oriented to person, place, and time.     Motor: No weakness.  Psychiatric:        Mood and Affect: Mood and affect normal.        Behavior: Behavior normal.     BP 132/74   Pulse 88   Ht 5' 1" (1.549 m)   Wt 130 lb 14.4 oz (59.4 kg)   BMI 24.73 kg/m  Wt Readings from Last 3 Encounters:  07/19/21 130 lb 14.4 oz (59.4 kg)  04/19/21 134 lb (60.8 kg)  03/22/21 134 lb 8 oz (61 kg)     Health Maintenance Due  Topic Date Due   TETANUS/TDAP  Never done   DEXA SCAN   Never done   Zoster Vaccines- Shingrix (2 of 2) 07/30/2013   Pneumonia Vaccine 60+ Years old (2 - PCV) 09/10/2015   COVID-19 Vaccine (4 - Pfizer series) 02/12/2020    There are  no preventive care reminders to display for this patient.  Lab Results  Component Value Date   TSH 2.60 01/10/2021   Lab Results  Component Value Date   WBC 7.4 03/20/2021   HGB 13.9 03/20/2021   HCT 43.2 03/20/2021   MCV 95.4 03/20/2021   PLT 166 03/20/2021   Lab Results  Component Value Date   NA 141 03/20/2021   K 3.4 (L) 03/20/2021   CO2 27 03/20/2021   GLUCOSE 129 (H) 03/20/2021   BUN 18 03/20/2021   CREATININE 0.85 03/20/2021   BILITOT 0.9 01/10/2021   ALKPHOS 72 07/02/2020   AST 24 01/10/2021   ALT 16 01/10/2021   PROT 7.6 01/10/2021   ALBUMIN 4.5 07/02/2020   CALCIUM 9.2 03/20/2021   ANIONGAP 11 03/20/2021   EGFR 73 01/10/2021   Lab Results  Component Value Date   CHOL 183 01/10/2021   Lab Results  Component Value Date   HDL 75 01/10/2021   Lab Results  Component Value Date   LDLCALC 93 01/10/2021   Lab Results  Component Value Date   TRIG 61 01/10/2021   Lab Results  Component Value Date   CHOLHDL 2.4 01/10/2021   No results found for: "HGBA1C"    Assessment & Plan:   Problem List Items Addressed This Visit       Cardiovascular and Mediastinum   Hypertension    The following hypertensive lifestyle modification were recommended and discussed:  1. Limiting alcohol intake to less than 1 oz/day of ethanol:(24 oz of beer or 8 oz of wine or 2 oz of 100-proof whiskey). 2. Take baby ASA 81 mg daily. 3. Importance of regular aerobic exercise and losing weight. 4. Reduce dietary saturated fat and cholesterol intake for overall cardiovascular health. 5. Maintaining adequate dietary potassium, calcium, and magnesium intake. 6. Regular monitoring of the blood pressure. 7. Reduce sodium intake to less than 100 mmol/day (less than 2.3 gm of sodium or less than 6 gm of sodium  choride)         Musculoskeletal and Integument   Sprain of right rotator cuff capsule    Suggest shoulder exercises        Other   Hyperlipidemia    Hypercholesterolemia  I advised the patient to follow Mediterranean diet This diet is rich in fruits vegetables and whole grain, and This diet is also rich in fish and lean meat Patient should also eat a handful of almonds or walnuts daily Recent heart study indicated that average follow-up on this kind of diet reduces the cardiovascular mortality by 50 to 70%==      Other Visit Diagnoses     Hypokalemia    -  Primary     DASH Diet: Care Instructions Your Care Instructions  The DASH diet is an eating plan that can help lower your blood pressure. DASH stands for Dietary Approaches to Stop Hypertension. Hypertension is high blood pressure. The DASH diet focuses on eating foods that are high in calcium, potassium, and magnesium. These nutrients can lower blood pressure. The foods that are highest in these nutrients are fruits, vegetables, low-fat dairy products, nuts, seeds, and legumes. But taking calcium, potassium, and magnesium supplements instead of eating foods that are high in those nutrients does not have the same effect. The DASH diet also includes whole grains, fish, and poultry. The DASH diet is one of several lifestyle changes your doctor may recommend to lower your high blood pressure. Your doctor may also want you to  decrease the amount of sodium in your diet. Lowering sodium while following the DASH diet can lower blood pressure even further than just the DASH diet alone. Follow-up care is a key part of your treatment and safety. Be sure to make and go to all appointments, and call your doctor if you are having problems. It's also a good idea to know your test results and keep a list of the medicines you take. How can you care for yourself at home? Following the DASH diet  Eat 4 to 5 servings of fruit each day. A serving  is 1 medium-sized piece of fruit,  cup chopped or canned fruit, 1/4 cup dried fruit, or 4 ounces ( cup) of fruit juice. Choose fruit more often than fruit juice.  Eat 4 to 5 servings of vegetables each day. A serving is 1 cup of lettuce or raw leafy vegetables,  cup of chopped or cooked vegetables, or 4 ounces ( cup) of vegetable juice. Choose vegetables more often than vegetable juice.  Get 2 to 3 servings of low-fat and fat-free dairy each day. A serving is 8 ounces of milk, 1 cup of yogurt, or 1  ounces of cheese.  Eat 6 to 8 servings of grains each day. A serving is 1 slice of bread, 1 ounce of dry cereal, or  cup of cooked rice, pasta, or cooked cereal. Try to choose whole-grain products as much as possible.  Limit lean meat, poultry, and fish to 2 servings each day. A serving is 3 ounces, about the size of a deck of cards.  Eat 4 to 5 servings of nuts, seeds, and legumes (cooked dried beans, lentils, and split peas) each week. A serving is 1/3 cup of nuts, 2 tablespoons of seeds, or  cup of cooked beans or peas.  Limit fats and oils to 2 to 3 servings each day. A serving is 1 teaspoon of vegetable oil or 2 tablespoons of salad dressing.  Limit sweets and added sugars to 5 servings or less a week. A serving is 1 tablespoon jelly or jam,  cup sorbet, or 1 cup of lemonade.  Eat less than 2,300 milligrams (mg) of sodium a day. If you limit your sodium to 1,500 mg a day, you can lower your blood pressure even more. Tips for success  Start small. Do not try to make dramatic changes to your diet all at once. You might feel that you are missing out on your favorite foods and then be more likely to not follow the plan. Make small changes, and stick with them. Once those changes become habit, add a few more changes.  Try some of the following: ? Make it a goal to eat a fruit or vegetable at every meal and at snacks. This will make it easy to get the recommended amount of fruits and  vegetables each day. ? Try yogurt topped with fruit and nuts for a snack or healthy dessert. ? Add lettuce, tomato, cucumber, and onion to sandwiches. ? Combine a ready-made pizza crust with low-fat mozzarella cheese and lots of vegetable toppings. Try using tomatoes, squash, spinach, broccoli, carrots, cauliflower, and onions. ? Have a variety of cut-up vegetables with a low-fat dip as an appetizer instead of chips and dip. ? Sprinkle sunflower seeds or chopped almonds over salads. Or try adding chopped walnuts or almonds to cooked vegetables. ? Try some vegetarian meals using beans and peas. Add garbanzo or kidney beans to salads. Make burritos and tacos with mashed pinto  beans or black beans.  No orders of the defined types were placed in this encounter.   Follow-up: No follow-ups on file.    Cletis Athens, MD

## 2021-07-19 NOTE — Assessment & Plan Note (Signed)
Hypercholesterolemia  I advised the patient to follow Mediterranean diet This diet is rich in fruits vegetables and whole grain, and This diet is also rich in fish and lean meat Patient should also eat a handful of almonds or walnuts daily Recent heart study indicated that average follow-up on this kind of diet reduces the cardiovascular mortality by 50 to 70%== 

## 2021-07-19 NOTE — Assessment & Plan Note (Signed)

## 2021-08-07 ENCOUNTER — Ambulatory Visit (INDEPENDENT_AMBULATORY_CARE_PROVIDER_SITE_OTHER): Payer: Medicare Other | Admitting: Podiatry

## 2021-08-07 DIAGNOSIS — M19072 Primary osteoarthritis, left ankle and foot: Secondary | ICD-10-CM

## 2021-08-07 DIAGNOSIS — M19079 Primary osteoarthritis, unspecified ankle and foot: Secondary | ICD-10-CM

## 2021-08-07 NOTE — Progress Notes (Signed)
  Subjective:  Patient ID: Kathy Morgan, female    DOB: 1934-10-09,  MRN: [id number]  Chief Complaint  Patient presents with   Foot Pain    Patient repots midfoot left foot pain today. Patient states this is an ongoing issue and injections usually make it feel better.    87 y.o. female presents with the above complaint. History confirmed with patient.  She normally sees Dr. Allena Katz for this.  He has done corticosteroid injections which has been very helpful for her.  She last had 1 in March.  Objective:  Physical Exam: warm, good capillary refill, no trophic changes or ulcerative lesions, normal DP and PT pulses, normal sensory exam, and pain tenderness and edema over the fourth and fifth tarsometatarsal joints  Assessment:   1. Arthritis of midfoot      Plan:  Patient was evaluated and treated and all questions answered.  So far has done well with corticosteroid injection.  Following sterile prep with Betadine I injected 0.5 cc of Marcaine with 2 mg of dexamethasone and 5 mg of Kenalog into the fourth and fifth tarsometatarsal joints.  She tolerated this well.  Follow-up as needed  Return if symptoms worsen or fail to improve.

## 2021-08-10 ENCOUNTER — Other Ambulatory Visit: Payer: Self-pay | Admitting: Internal Medicine

## 2021-08-10 DIAGNOSIS — I10 Essential (primary) hypertension: Secondary | ICD-10-CM

## 2021-09-19 ENCOUNTER — Ambulatory Visit (INDEPENDENT_AMBULATORY_CARE_PROVIDER_SITE_OTHER): Payer: Medicare Other | Admitting: Podiatry

## 2021-09-19 DIAGNOSIS — M19072 Primary osteoarthritis, left ankle and foot: Secondary | ICD-10-CM

## 2021-09-19 DIAGNOSIS — M19079 Primary osteoarthritis, unspecified ankle and foot: Secondary | ICD-10-CM

## 2021-09-26 NOTE — Progress Notes (Signed)
Subjective:  Patient ID: Kathy Morgan, female    DOB: Oct 19, 1934,  MRN: [id number]  Chief Complaint  Patient presents with   Injections    87 y.o. female presents with the above complaint.  Patient was also followed on dorsal midfoot pain/arthritic pain issue she states that it started acting up again.  She would like another injection she denies any other acute complaints  Review of Systems: Negative except as noted in the HPI. Denies N/V/F/Ch.  Past Medical History:  Diagnosis Date   Arthritis    lower back   Hyperlipidemia    Hypertension    Vertigo    Wears dentures    full upper    Current Outpatient Medications:    amLODipine (NORVASC) 5 MG tablet, TAKE 1 TABLET (5 MG TOTAL) BY MOUTH DAILY., Disp: 90 tablet, Rfl: 3   atorvastatin (LIPITOR) 10 MG tablet, TAKE 1 TABLET DAILY, Disp: 90 tablet, Rfl: 3   cholecalciferol (VITAMIN D) 400 UNITS TABS tablet, Take 1,000 Units by mouth daily., Disp: , Rfl:    metoprolol succinate (TOPROL-XL) 50 MG 24 hr tablet, Take 1 tablet (50 mg total) by mouth daily. Take with or immediately following a meal., Disp: 90 tablet, Rfl: 3   potassium chloride SA (KLOR-CON M) 20 MEQ tablet, Take 1 tablet (20 mEq total) by mouth daily., Disp: 30 tablet, Rfl: 3  Social History   Tobacco Use  Smoking Status Never  Smokeless Tobacco Never    No Known Allergies Objective:  There were no vitals filed for this visit. There is no height or weight on file to calculate BMI. Constitutional Well developed. Well nourished.  Vascular Dorsalis pedis pulses palpable bilaterally. Posterior tibial pulses palpable bilaterally. Capillary refill normal to all digits.  No cyanosis or clubbing noted. Pedal hair growth normal.  Neurologic Normal speech. Oriented to person, place, and time. Epicritic sensation to light touch grossly present bilaterally.  Dermatologic Nails well groomed and normal in appearance. No open wounds. No skin lesions.  Orthopedic:  Pain on palpation to the left dorsal midfoot.  Pain at the third fourth and fifth tarsometatarsal joint.  Pain with range of motion of the joints.  No pain at the metatarsophalangeal joints.  No extensor or flexor tendinitis noted.   Radiographs: 3 views of skeletally mature adult left foot: Osteoarthritic changes noted of the left first metatarsophalangeal joint severe in nature.  Severe osteoarthritic changes noted of the midfoot joints as well.  Flatfoot foot structure noted.  No other bony abnormalities identified Assessment:   1. Arthritis of midfoot        Plan:  Patient was evaluated and treated and all questions answered.  Left dorsal midfoot arthritis -I explained the patient the etiology of arthritis and various treatment options were discussed.  Given the amount of pain she is having I believe she will benefit from a steroid injection to help decrease acute inflammatory component associated pain.  Patient agrees with plan like to proceed with a steroid injection. -Another steroid injection was performed at left dorsal midfoot at point of maximal tenderness using 1% plain Lidocaine and 10 mg of Kenalog. This was well tolerated. -Continue using Voltaren gel over-the-counter.  Right second mild toe contusion  -I explained to the patient the etiology of toe contusion versus treatment options were discussed.  I do not see any ecchymosis or bruising.  The nail appears to be well adhered to the underlying nailbed.  I discussed with the patient that in the future the nail  may fall off if there is too much contusion.  Only time will tell.  She states understanding. -At this time I discussed that she can do buddy splinting to can help and allow the soft tissue to heal however her pain is no longer there and this injury happened few weeks ago.  She states she will do her best.  No follow-ups on file.

## 2021-10-11 ENCOUNTER — Ambulatory Visit: Payer: Medicare Other | Admitting: Internal Medicine

## 2021-10-12 ENCOUNTER — Ambulatory Visit: Payer: Medicare Other

## 2021-10-13 ENCOUNTER — Ambulatory Visit (INDEPENDENT_AMBULATORY_CARE_PROVIDER_SITE_OTHER): Payer: Medicare Other | Admitting: *Deleted

## 2021-10-13 DIAGNOSIS — E7800 Pure hypercholesterolemia, unspecified: Secondary | ICD-10-CM

## 2021-10-13 DIAGNOSIS — I10 Essential (primary) hypertension: Secondary | ICD-10-CM

## 2021-10-13 LAB — BASIC METABOLIC PANEL: Potassium: 4.5 mEq/L (ref 3.5–5.1)

## 2021-10-14 ENCOUNTER — Other Ambulatory Visit: Payer: Self-pay | Admitting: Internal Medicine

## 2021-10-14 DIAGNOSIS — E876 Hypokalemia: Secondary | ICD-10-CM

## 2021-10-17 ENCOUNTER — Ambulatory Visit (INDEPENDENT_AMBULATORY_CARE_PROVIDER_SITE_OTHER): Payer: Medicare Other | Admitting: Internal Medicine

## 2021-10-17 ENCOUNTER — Encounter: Payer: Self-pay | Admitting: Internal Medicine

## 2021-10-17 VITALS — BP 132/75 | HR 80 | Ht 61.0 in | Wt 131.2 lb

## 2021-10-17 DIAGNOSIS — E7800 Pure hypercholesterolemia, unspecified: Secondary | ICD-10-CM

## 2021-10-17 DIAGNOSIS — I10 Essential (primary) hypertension: Secondary | ICD-10-CM

## 2021-10-17 DIAGNOSIS — R42 Dizziness and giddiness: Secondary | ICD-10-CM | POA: Diagnosis not present

## 2021-10-17 DIAGNOSIS — S43421D Sprain of right rotator cuff capsule, subsequent encounter: Secondary | ICD-10-CM

## 2021-10-17 DIAGNOSIS — M62838 Other muscle spasm: Secondary | ICD-10-CM

## 2021-10-17 LAB — UNLABELED: Test Ordered On Req: 10231

## 2021-10-17 LAB — CBC WITH DIFFERENTIAL/PLATELET
Absolute Monocytes: 462 cells/uL (ref 200–950)
Basophils Absolute: 42 cells/uL (ref 0–200)
Basophils Relative: 0.7 %
Eosinophils Absolute: 120 cells/uL (ref 15–500)
Eosinophils Relative: 2 %
HCT: 41.2 % (ref 35.0–45.0)
Hemoglobin: 13.8 g/dL (ref 11.7–15.5)
Lymphs Abs: 2094 cells/uL (ref 850–3900)
MCH: 31.8 pg (ref 27.0–33.0)
MCHC: 33.5 g/dL (ref 32.0–36.0)
MCV: 94.9 fL (ref 80.0–100.0)
MPV: 11.8 fL (ref 7.5–12.5)
Monocytes Relative: 7.7 %
Neutro Abs: 3282 cells/uL (ref 1500–7800)
Neutrophils Relative %: 54.7 %
Platelets: 158 10*3/uL (ref 140–400)
RBC: 4.34 10*6/uL (ref 3.80–5.10)
RDW: 12.5 % (ref 11.0–15.0)
Total Lymphocyte: 34.9 %
WBC: 6 10*3/uL (ref 3.8–10.8)

## 2021-10-17 NOTE — Assessment & Plan Note (Signed)
Hypercholesterolemia  I advised the patient to follow Mediterranean diet This diet is rich in fruits vegetables and whole grain, and This diet is also rich in fish and lean meat Patient should also eat a handful of almonds or walnuts daily Recent heart study indicated that average follow-up on this kind of diet reduces the cardiovascular mortality by 50 to 70%== 

## 2021-10-17 NOTE — Assessment & Plan Note (Signed)
Stable

## 2021-10-17 NOTE — Progress Notes (Signed)
Established Patient Office Visit  Subjective:  Patient ID: Kathy Morgan, female    DOB: 10/07/1934  Age: 87 y.o. MRN: [id number]  CC:  Chief Complaint  Patient presents with   Annual Exam    HPI  Kathy Morgan presents for check up  Past Medical History:  Diagnosis Date   Arthritis    lower back   Hyperlipidemia    Hypertension    Vertigo    Wears dentures    full upper    Past Surgical History:  Procedure Laterality Date   BREAST SURGERY Right 2011   R cyst removal (benign)    CATARACT EXTRACTION W/PHACO Right 01/05/2019   Procedure: CATARACT EXTRACTION PHACO AND INTRAOCULAR LENS PLACEMENT (IOC) RIGHT 2.88  00:29.7;  Surgeon: Eulogio Bear, MD;  Location: [facility];  Service: Ophthalmology;  Laterality: Right;   CATARACT EXTRACTION W/PHACO Left 02/23/2019   Procedure: CATARACT EXTRACTION PHACO AND INTRAOCULAR LENS PLACEMENT (IOC) LEFT 4.68  00:41.3;  Surgeon: Eulogio Bear, MD;  Location: [facility];  Service: Ophthalmology;  Laterality: Left;  needs to stay 2nd   [place]    Family History  Problem Relation Age of Onset   Cancer Mother    Dementia Father     Social History   Socioeconomic History   Marital status: Widowed    Spouse name: Not on file   Number of children: Not on file   Years of education: Not on file   Highest education level: Not on file  Occupational History   Not on file  Tobacco Use   Smoking status: Never   Smokeless tobacco: Never  Vaping Use   Vaping Use: Never used  Substance and Sexual Activity   Alcohol use: Yes    Alcohol/week: 2.0 standard drinks of alcohol    Types: 2 Glasses of wine per week   Drug use: No   Sexual activity: Never  Other Topics Concern   Not on file  Social History Narrative   Not on file   Social Determinants of Health   Financial Resource Strain: Low Risk  (09/02/2020)   Overall Financial Resource Strain (CARDIA)    Difficulty of Paying Living  Expenses: Not hard at all  Food Insecurity: No Food Insecurity (09/02/2020)   Hunger Vital Sign    Worried About Running Out of Food in the Last Year: Never true    Ran Out of Food in the Last Year: Never true  Transportation Needs: No Transportation Needs (09/02/2020)   PRAPARE - Hydrologist (Medical): No    Lack of Transportation (Non-Medical): No  Physical Activity: Insufficiently Active (09/02/2020)   Exercise Vital Sign    Days of Exercise per Week: 2 days    Minutes of Exercise per Session: 20 min  Stress: No Stress Concern Present (09/02/2020)   [place]    Feeling of Stress : Not at all  Social Connections: Moderately Integrated (09/02/2020)   Social Connection and Isolation Panel [NHANES]    Frequency of Communication with Friends and Family: More than three times a week    Frequency of Social Gatherings with Friends and Family: More than three times a week    Attends Religious Services: More than 4 times per year    Active Member of [employer] or Organizations: Yes    Attends Archivist Meetings: More than 4 times per year    Marital Status:  Widowed  Intimate Partner Violence: Not At Risk (09/02/2020)   Humiliation, Afraid, Rape, and Kick questionnaire    Fear of Current or Ex-Partner: No    Emotionally Abused: No    Physically Abused: No    Sexually Abused: No     Current Outpatient Medications:    amLODipine (NORVASC) 5 MG tablet, TAKE 1 TABLET (5 MG TOTAL) BY MOUTH DAILY., Disp: 90 tablet, Rfl: 3   atorvastatin (LIPITOR) 10 MG tablet, TAKE 1 TABLET DAILY, Disp: 90 tablet, Rfl: 3   cholecalciferol (VITAMIN D) 400 UNITS TABS tablet, Take 1,000 Units by mouth daily., Disp: , Rfl:    KLOR-CON M20 20 MEQ tablet, TAKE 1 TABLET BY MOUTH EVERY DAY, Disp: 90 tablet, Rfl: 1   metoprolol succinate (TOPROL-XL) 50 MG 24 hr tablet, Take 1 tablet (50 mg total) by mouth daily. Take with or  immediately following a meal., Disp: 90 tablet, Rfl: 3   No Known Allergies  ROS Review of Systems  Constitutional: Negative.   HENT: Negative.    Eyes: Negative.   Respiratory: Negative.    Cardiovascular: Negative.   Gastrointestinal: Negative.   Endocrine: Negative.   Genitourinary: Negative.   Musculoskeletal: Negative.   Skin: Negative.   Allergic/Immunologic: Negative.   Neurological: Negative.   Hematological: Negative.   Psychiatric/Behavioral: Negative.    All other systems reviewed and are negative.     Objective:    Physical Exam Vitals reviewed.  Constitutional:      Appearance: Normal appearance.  HENT:     Mouth/Throat:     Mouth: Mucous membranes are moist.  Eyes:     Pupils: Pupils are equal, round, and reactive to light.  Neck:     Vascular: No carotid bruit.  Cardiovascular:     Rate and Rhythm: Normal rate and regular rhythm.     Pulses: Normal pulses.     Heart sounds: Normal heart sounds.  Pulmonary:     Effort: Pulmonary effort is normal.     Breath sounds: Normal breath sounds.  Abdominal:     General: Bowel sounds are normal.     Palpations: Abdomen is soft. There is no hepatomegaly, splenomegaly or mass.     Tenderness: There is no abdominal tenderness.     Hernia: No hernia is present.  Musculoskeletal:        General: No tenderness.     Cervical back: Neck supple.     Right lower leg: No edema.     Left lower leg: No edema.  Skin:    Findings: No rash.  Neurological:     Mental Status: She is alert and oriented to person, place, and time.     Motor: No weakness.  Psychiatric:        Mood and Affect: Mood and affect normal.        Behavior: Behavior normal.     BP 132/75   Pulse 80   Ht _0  (1.549 m)   Wt 131 lb 3.2 oz (59.5 kg)   BMI 24.79 kg/m  Wt Readings from Last 3 Encounters:  10/17/21 131 lb 3.2 oz (59.5 kg)  07/19/21 130 lb 14.4 oz (59.4 kg)  04/19/21 134 lb (60.8 kg)     Health Maintenance Due  Topic  Date Due   DEXA SCAN  Never done   Zoster Vaccines- Shingrix (2 of 2) 07/30/2013   Pneumonia Vaccine 24+ Years old (2 - PCV) 09/10/2015    There are no preventive care reminders to  display for this patient.  Lab Results  Component Value Date   TSH 2.60 01/10/2021   Lab Results  Component Value Date   WBC 6.0 10/13/2021   HGB 13.8 10/13/2021   HCT 41.2 10/13/2021   MCV 94.9 10/13/2021   PLT 158 10/13/2021   Lab Results  Component Value Date   NA 141 03/20/2021   K 4.5 10/13/2021   CO2 27 03/20/2021   GLUCOSE 129 (H) 03/20/2021   BUN 18 03/20/2021   CREATININE 0.85 03/20/2021   BILITOT 0.9 01/10/2021   ALKPHOS 72 07/02/2020   AST 24 01/10/2021   ALT 16 01/10/2021   PROT 7.6 01/10/2021   ALBUMIN 4.5 07/02/2020   CALCIUM 9.2 03/20/2021   ANIONGAP 11 03/20/2021   EGFR 73 01/10/2021   Lab Results  Component Value Date   CHOL 183 01/10/2021   Lab Results  Component Value Date   HDL 75 01/10/2021   Lab Results  Component Value Date   LDLCALC 93 01/10/2021   Lab Results  Component Value Date   TRIG 61 01/10/2021   Lab Results  Component Value Date   CHOLHDL 2.4 01/10/2021   No results found for: "HGBA1C"    Assessment & Plan:   Problem List Items Addressed This Visit       Cardiovascular and Mediastinum   Hypertension - Primary    Blood pressure under control        Musculoskeletal and Integument   Sprain of right rotator cuff capsule    Stable      Neck muscle spasm     Other   Hyperlipidemia    Hypercholesterolemia  I advised the patient to follow Mediterranean diet This diet is rich in fruits vegetables and whole grain, and This diet is also rich in fish and lean meat Patient should also eat a handful of almonds or walnuts daily Recent heart study indicated that average follow-up on this kind of diet reduces the cardiovascular mortality by 50 to 70%==      Vertigo    Stable      Patient was advised to have a shingles shot, COVID  shot and pneumonia vaccine No orders of the defined types were placed in this encounter.      Cletis Athens, MD

## 2021-10-17 NOTE — Assessment & Plan Note (Signed)
Blood pressure under control 

## 2021-11-22 ENCOUNTER — Other Ambulatory Visit: Payer: Self-pay

## 2021-11-22 DIAGNOSIS — I10 Essential (primary) hypertension: Secondary | ICD-10-CM

## 2021-11-22 MED ORDER — AMLODIPINE BESYLATE 5 MG PO TABS: 5.0000 mg | ORAL_TABLET | Freq: Every day | ORAL | 3 refills | Status: DC

## 2021-12-06 ENCOUNTER — Ambulatory Visit: Payer: Medicare Other | Admitting: Podiatry

## 2021-12-08 ENCOUNTER — Ambulatory Visit (INDEPENDENT_AMBULATORY_CARE_PROVIDER_SITE_OTHER): Payer: Medicare Other | Admitting: Podiatry

## 2021-12-08 DIAGNOSIS — M778 Other enthesopathies, not elsewhere classified: Secondary | ICD-10-CM | POA: Diagnosis not present

## 2021-12-15 DIAGNOSIS — M778 Other enthesopathies, not elsewhere classified: Secondary | ICD-10-CM | POA: Diagnosis not present

## 2021-12-15 MED ORDER — BETAMETHASONE SOD PHOS & ACET 6 (3-3) MG/ML IJ SUSP
3.0000 mg | Freq: Once | INTRAMUSCULAR | Status: AC
  Administered 2021-12-15: 3 mg via INTRA_ARTICULAR

## 2021-12-15 NOTE — Progress Notes (Signed)
   Chief Complaint  Patient presents with   Foot Pain    Patient is here for left foot pain, she states that she got injection last visit and it did not help with the pain.    HPI: 87 y.o. female PMHx arthritis of the left midfoot presenting today for follow-up evaluation of degenerative changes and chronic left foot pain.  Denies a history of injury.  Gradual onset.  Patient has recent cortisone injections in the past which do help temporarily for a few months.  Gradual return of the symptoms and arthritic pain.  Patient is actually requesting an injection today.  Presenting for further treatment and evaluation  Past Medical History:  Diagnosis Date   Arthritis    lower back   Hyperlipidemia    Hypertension    Vertigo    Wears dentures    full upper    Past Surgical History:  Procedure Laterality Date   BREAST SURGERY Right 2011   R cyst removal (benign)    CATARACT EXTRACTION W/PHACO Right 01/05/2019   Procedure: CATARACT EXTRACTION PHACO AND INTRAOCULAR LENS PLACEMENT (IOC) RIGHT 2.88  00:29.7;  Surgeon: Nevada Crane, MD;  [facility];  Service: Ophthalmology;  Laterality: Right;   CATARACT EXTRACTION W/PHACO Left 02/23/2019   Procedure: CATARACT EXTRACTION PHACO AND INTRAOCULAR LENS PLACEMENT (IOC) LEFT 4.68  00:41.3;  Surgeon: Nevada Crane, MD;  [facility];  Service: Ophthalmology;  Laterality: Left;  needs to stay 2nd   VAGINAL HYSTERECTOMY  1991    No Known Allergies   Physical Exam: General: The patient is alert and oriented x3 in no acute distress.  Dermatology: Skin is warm, dry and supple bilateral lower extremities. Negative for open lesions or macerations.  Vascular: Palpable pedal pulses bilaterally. Capillary refill within normal limits.  Negative for any significant edema or erythema  Neurological: Light touch and protective threshold grossly intact  Musculoskeletal Exam: No pedal deformities noted.  There is some  tenderness with palpation throughout the left midfoot  Assessment and plan of care: 1.  Capsulitis left midfoot -Injection of 0.5 cc Celestone Soluspan injected into the left midfoot/midtarsal area -Continue wearing good supportive shoes and sneakers.  Recommend shoes with good arch supports to support the medial longitudinal arch of the foot and prevent midfoot collapse -Advised against going barefoot -Return to clinic as needed.  X-rays were not taken today.  Patient will require left foot x-rays if and when she returns to the office      Felecia Shelling, DPM [facility]  Dr. Felecia Shelling, DPM    [address] [place], [place] [address]                Office [phone]  Fax [phone]

## 2022-01-15 ENCOUNTER — Ambulatory Visit: Payer: Medicare Other | Admitting: Internal Medicine

## 2022-01-26 ENCOUNTER — Other Ambulatory Visit: Payer: Self-pay | Admitting: Internal Medicine

## 2022-02-16 ENCOUNTER — Emergency Department
Admission: EM | Admit: 2022-02-16 | Discharge: 2022-02-16 | Disposition: A | Discharge status: Home / Self Care | Payer: Medicare Other | Attending: Emergency Medicine | Admitting: Emergency Medicine

## 2022-02-16 ENCOUNTER — Other Ambulatory Visit: Payer: Self-pay

## 2022-02-16 DIAGNOSIS — Z20822 Contact with and (suspected) exposure to covid-19: Secondary | ICD-10-CM | POA: Insufficient documentation

## 2022-02-16 DIAGNOSIS — R059 Cough, unspecified: Secondary | ICD-10-CM | POA: Diagnosis not present

## 2022-02-16 DIAGNOSIS — I10 Essential (primary) hypertension: Secondary | ICD-10-CM | POA: Diagnosis not present

## 2022-02-16 DIAGNOSIS — R030 Elevated blood-pressure reading, without diagnosis of hypertension: Secondary | ICD-10-CM | POA: Diagnosis present

## 2022-02-16 LAB — COMPREHENSIVE METABOLIC PANEL
ALT: 22 U/L (ref 0–44)
AST: 31 U/L (ref 15–41)
Albumin: 4.4 g/dL (ref 3.5–5.0)
Alkaline Phosphatase: 80 U/L (ref 38–126)
Anion gap: 12 (ref 5–15)
BUN: 16 mg/dL (ref 8–23)
CO2: 24 mmol/L (ref 22–32)
Calcium: 9.3 mg/dL (ref 8.9–10.3)
Chloride: 102 mmol/L (ref 98–111)
Creatinine, Ser: 0.79 mg/dL (ref 0.44–1.00)
GFR, Estimated: >60 mL/min (ref >60)
Glucose, Bld: 99 mg/dL (ref 70–99)
Potassium: 3.8 mmol/L (ref 3.5–5.1)
Sodium: 138 mmol/L (ref 135–145)
Total Bilirubin: 1.4 mg/dL — ABNORMAL HIGH (ref 0.3–1.2)
Total Protein: 8.6 g/dL — ABNORMAL HIGH (ref 6.5–8.1)

## 2022-02-16 LAB — CBC
HCT: 44.4 % (ref 36.0–46.0)
Hemoglobin: 14.5 g/dL (ref 12.0–15.0)
MCH: 30.7 pg (ref 26.0–34.0)
MCHC: 32.7 g/dL (ref 30.0–36.0)
MCV: 93.9 fL (ref 80.0–100.0)
Platelets: 172 10*3/uL (ref 150–400)
RBC: 4.73 MIL/uL (ref 3.87–5.11)
RDW: 14.2 % (ref 11.5–15.5)
WBC: 7.8 10*3/uL (ref 4.0–10.5)
nRBC: 0 % (ref 0.0–0.2)

## 2022-02-16 LAB — RESP PANEL BY RT-PCR (RSV, FLU A&B, COVID)  RVPGX2
Influenza A by PCR: NEGATIVE
Influenza B by PCR: NEGATIVE
Resp Syncytial Virus by PCR: NEGATIVE
SARS Coronavirus 2 by RT PCR: NEGATIVE

## 2022-02-16 NOTE — ED Provider Notes (Signed)
[facility] Provider Note    Event Date/Time   First MD Initiated Contact with Patient 02/16/22 1203     (approximate)  History   Chief Complaint: Hypertension and Cough  HPI  Kathy Morgan is a 88 y.o. female with a past medical history of hypertension, hyperlipidemia, presents to the emergency department for elevated blood pressure.  According to the patient for the past several weeks she has had an on and off cough, states she went to a drugstore today and had her blood pressure checked and it read "high."  Patient was concerned so she came to the emergency department for evaluation.  Upon arrival to the emergency department patient's blood pressure is 163/82.  Patient denies any headache no chest pain no shortness of breath denies really any other symptoms.  States her cough has pretty much resolved over the past couple days.  States she was more concerned about her blood pressure.  Physical Exam   Triage Vital Signs: ED Triage Vitals  Enc Vitals Group     BP 02/16/22 1154 (!) 163/82     Pulse Rate 02/16/22 1154 82     Resp 02/16/22 1154 18     Temp 02/16/22 1154 97.6 F (36.4 C)     Temp src --      SpO2 02/16/22 1154 94 %     Weight 02/16/22 1215 131 lb 2.8 oz (59.5 kg)     Height 02/16/22 1215 5' 1"$  (1.549 m)     Head Circumference --      Peak Flow --      Pain Score 02/16/22 1153 4     Pain Loc --      Pain Edu? --      Excl. in [place]? --     Most recent vital signs: Vitals:   02/16/22 1154 02/16/22 1215  BP: (!) 163/82 (!) 150/87  Pulse: 82 86  Resp: 18 16  Temp: 97.6 F (36.4 C)   SpO2: 94% 100%    General: Awake, no distress.  CV:  Good peripheral perfusion.  Regular rate and rhythm  Resp:  Normal effort.  Equal breath sounds bilaterally.  Abd:  No distention.  Soft, nontender.  No rebound or guarding.   ED Results / Procedures / Treatments   MEDICATIONS ORDERED IN ED: Medications - No data to display   IMPRESSION / MDM /  [place] / ED COURSE  I reviewed the triage vital signs and the nursing notes.  Patient's presentation is most consistent with acute presentation with potential threat to life or bodily function.  Patient presents emergency department for on and off cough times several weeks now with high blood pressure.  Reassuringly patient's exam is reassuring, blood pressure is slightly elevated however has decreased from arrival currently 150/87 otherwise vitals are normal.  Patient takes her blood pressure medication.  Chemistry is reassuring, CBC is normal.  Patient's COVID/flu/RSV is negative.  Given the patient's reassuring physical exam reassuring workup and reassuring vitals I believe the patient is safe for discharge home.  Do not believe the patient needs any medication adjustments emergently and we will leave this up to her primary care doctor.  Patient is agreeable to this plan of care and reassured by today's workup.  FINAL CLINICAL IMPRESSION(S) / ED DIAGNOSES   Hypertension   Note:  This document was prepared using Dragon voice recognition software and may include unintentional dictation errors.   Harvest Dark, MD 02/16/22 1250

## 2022-02-16 NOTE — ED Triage Notes (Addendum)
Pt to ED via POV from home. Pt ambulatory to triage in NAD. Pt reports she has been coughing for a few days and they took her BP at the drug store and it was "high". Pt with hx HTN and has been med compliant. BP in triage 163/82.

## 2022-02-21 ENCOUNTER — Telehealth: Payer: Self-pay

## 2022-02-21 NOTE — Telephone Encounter (Signed)
        Patient  visited [facility] on 2/16    Telephone encounter attempt : 1st   A HIPAA compliant voice message was left requesting a return call.  Instructed patient to call back    [facility] [phone] [address], [place], [place] [address] Phone: [phone] Email: [email or web address]

## 2022-02-22 ENCOUNTER — Telehealth: Payer: Self-pay

## 2022-02-22 NOTE — Telephone Encounter (Signed)
     Patient  visit on 2/16  at [facility]   Have you been able to follow up with your primary care physician? No    The patient was or was not able to obtain any needed medicine or equipment. Yes   Are there diet recommendations that you are having difficulty following? Na   Patient expresses understanding of discharge instructions and education provided has no other needs at this time. Yes      [facility] [phone] [address], [place], [place] [address] Phone: [phone] Email: [email or web address]

## 2022-03-09 ENCOUNTER — Ambulatory Visit (INDEPENDENT_AMBULATORY_CARE_PROVIDER_SITE_OTHER): Payer: Medicare Other | Admitting: Podiatry

## 2022-03-09 DIAGNOSIS — M778 Other enthesopathies, not elsewhere classified: Secondary | ICD-10-CM | POA: Diagnosis not present

## 2022-03-09 NOTE — Progress Notes (Signed)
  Subjective:  Patient ID: Kathy Morgan, female    DOB: 10-Nov-1934,  MRN: [id number]  Chief Complaint  Patient presents with   Injections    88 y.o. female presents with the above complaint.  Patient was also followed on dorsal midfoot pain/arthritic pain issue she states that it started acting up again.  She would like another injection she denies any other acute complaints  Review of Systems: Negative except as noted in the HPI. Denies N/V/F/Ch.  Past Medical History:  Diagnosis Date   Arthritis    lower back   Hyperlipidemia    Hypertension    Vertigo    Wears dentures    full upper    Current Outpatient Medications:    amLODipine (NORVASC) 5 MG tablet, Take 1 tablet (5 mg total) by mouth daily., Disp: 90 tablet, Rfl: 3   atorvastatin (LIPITOR) 10 MG tablet, TAKE 1 TABLET DAILY, Disp: 90 tablet, Rfl: 3   cholecalciferol (VITAMIN D) 400 UNITS TABS tablet, Take 1,000 Units by mouth daily., Disp: , Rfl:    KLOR-CON M20 20 MEQ tablet, TAKE 1 TABLET BY MOUTH EVERY DAY, Disp: 90 tablet, Rfl: 1   metoprolol succinate (TOPROL-XL) 50 MG 24 hr tablet, Take 1 tablet (50 mg total) by mouth daily. Take with or immediately following a meal., Disp: 90 tablet, Rfl: 3  Social History   Tobacco Use  Smoking Status Never  Smokeless Tobacco Never    No Known Allergies Objective:  There were no vitals filed for this visit. There is no height or weight on file to calculate BMI. Constitutional Well developed. Well nourished.  Vascular Dorsalis pedis pulses palpable bilaterally. Posterior tibial pulses palpable bilaterally. Capillary refill normal to all digits.  No cyanosis or clubbing noted. Pedal hair growth normal.  Neurologic Normal speech. Oriented to person, place, and time. Epicritic sensation to light touch grossly present bilaterally.  Dermatologic Nails well groomed and normal in appearance. No open wounds. No skin lesions.  Orthopedic: Pain on palpation to the left  dorsal midfoot.  Pain at the third fourth and fifth tarsometatarsal joint.  Pain with range of motion of the joints.  No pain at the metatarsophalangeal joints.  No extensor or flexor tendinitis noted.   Radiographs: 3 views of skeletally mature adult left foot: Osteoarthritic changes noted of the left first metatarsophalangeal joint severe in nature.  Severe osteoarthritic changes noted of the midfoot joints as well.  Flatfoot foot structure noted.  No other bony abnormalities identified Assessment:   1. Capsulitis of left foot         Plan:  Patient was evaluated and treated and all questions answered.  Left dorsal midfoot arthritis -I explained the patient the etiology of arthritis and various treatment options were discussed.  Given the amount of pain she is having I believe she will benefit from a steroid injection to help decrease acute inflammatory component associated pain.  Patient agrees with plan like to proceed with a steroid injection. -Another steroid injection was performed at left dorsal midfoot at point of maximal tenderness using 1% plain Lidocaine and 10 mg of Kenalog. This was well tolerated. -Continue using Voltaren gel over-the-counter.  Right second mild toe contusion  -Clinically healed  No follow-ups on file.

## 2022-03-25 ENCOUNTER — Emergency Department
Admission: EM | Admit: 2022-03-25 | Discharge: 2022-03-25 | Disposition: A | Discharge status: Home / Self Care | Payer: Medicare Other | Attending: Emergency Medicine | Admitting: Emergency Medicine

## 2022-03-25 ENCOUNTER — Emergency Department: Payer: Medicare Other

## 2022-03-25 ENCOUNTER — Other Ambulatory Visit: Payer: Self-pay

## 2022-03-25 DIAGNOSIS — Z1152 Encounter for screening for COVID-19: Secondary | ICD-10-CM | POA: Insufficient documentation

## 2022-03-25 DIAGNOSIS — R42 Dizziness and giddiness: Secondary | ICD-10-CM | POA: Diagnosis present

## 2022-03-25 DIAGNOSIS — R55 Syncope and collapse: Secondary | ICD-10-CM | POA: Diagnosis not present

## 2022-03-25 LAB — CBC
HCT: 43.7 % (ref 36.0–46.0)
Hemoglobin: 14.3 g/dL (ref 12.0–15.0)
MCH: 31 pg (ref 26.0–34.0)
MCHC: 32.7 g/dL (ref 30.0–36.0)
MCV: 94.6 fL (ref 80.0–100.0)
Platelets: 157 10*3/uL (ref 150–400)
RBC: 4.62 MIL/uL (ref 3.87–5.11)
RDW: 14.2 % (ref 11.5–15.5)
WBC: 5.7 10*3/uL (ref 4.0–10.5)
nRBC: 0 % (ref 0.0–0.2)

## 2022-03-25 LAB — URINALYSIS, ROUTINE W REFLEX MICROSCOPIC
Bilirubin Urine: NEGATIVE
Glucose, UA: NEGATIVE mg/dL
Hgb urine dipstick: NEGATIVE
Ketones, ur: NEGATIVE mg/dL
Leukocytes,Ua: NEGATIVE
Nitrite: NEGATIVE
Protein, ur: NEGATIVE mg/dL
Specific Gravity, Urine: 1.01 (ref 1.005–1.030)
pH: 5 (ref 5.0–8.0)

## 2022-03-25 LAB — HEPATIC FUNCTION PANEL
ALT: 22 U/L (ref 0–44)
AST: 29 U/L (ref 15–41)
Albumin: 4.3 g/dL (ref 3.5–5.0)
Alkaline Phosphatase: 77 U/L (ref 38–126)
Bilirubin, Direct: 0.1 mg/dL (ref 0.0–0.2)
Indirect Bilirubin: 1 mg/dL — ABNORMAL HIGH (ref 0.3–0.9)
Total Bilirubin: 1.1 mg/dL (ref 0.3–1.2)
Total Protein: 8.5 g/dL — ABNORMAL HIGH (ref 6.5–8.1)

## 2022-03-25 LAB — BASIC METABOLIC PANEL
Anion gap: 6 (ref 5–15)
BUN: 15 mg/dL (ref 8–23)
CO2: 26 mmol/L (ref 22–32)
Calcium: 9 mg/dL (ref 8.9–10.3)
Chloride: 104 mmol/L (ref 98–111)
Creatinine, Ser: 0.8 mg/dL (ref 0.44–1.00)
GFR, Estimated: >60 mL/min (ref >60)
Glucose, Bld: 114 mg/dL — ABNORMAL HIGH (ref 70–99)
Potassium: 3.4 mmol/L — ABNORMAL LOW (ref 3.5–5.1)
Sodium: 136 mmol/L (ref 135–145)

## 2022-03-25 LAB — RESP PANEL BY RT-PCR (RSV, FLU A&B, COVID)  RVPGX2
Influenza A by PCR: NEGATIVE
Influenza B by PCR: NEGATIVE
Resp Syncytial Virus by PCR: NEGATIVE
SARS Coronavirus 2 by RT PCR: NEGATIVE

## 2022-03-25 LAB — D-DIMER, QUANTITATIVE: D-Dimer, Quant: 0.5 ug/mL-FEU (ref 0.00–0.50)

## 2022-03-25 LAB — TROPONIN I (HIGH SENSITIVITY)
Troponin I (High Sensitivity): 4 ng/L (ref <18)
Troponin I (High Sensitivity): 5 ng/L (ref <18)

## 2022-03-25 LAB — CBG MONITORING, ED: Glucose-Capillary: 91 mg/dL (ref 70–99)

## 2022-03-25 NOTE — ED Notes (Signed)
During prep for d/c   pt was given another opportunity to be admitted. Pt adamantly stated "I just wanna go home."

## 2022-03-25 NOTE — ED Notes (Signed)
Ambulated without issue around nursing station. Pt denies complaints

## 2022-03-25 NOTE — ED Triage Notes (Addendum)
Pt states not feeling well today. Pt states she started to feel dizzy and cold. Pt states this all started this morning. Pt walking with no deficits noted. Pt states no new tingling.

## 2022-03-25 NOTE — Discharge Instructions (Addendum)
We offered admission for possible near syncopal episode or something going on with your heart but you have opted to decline and your cardiac markers were negative you remained asymptomatic here.  Please return to the ER if you develop return of symptoms or any other concerns.  Otherwise we placed a referral for cardiology to follow-up with.

## 2022-03-25 NOTE — ED Provider Notes (Addendum)
[facility] Provider Note    Event Date/Time   First MD Initiated Contact with Patient 03/25/22 1123     (approximate)   History   Dizziness   HPI  Kathy Morgan is a 88 y.o. female comes in for dizziness.  Started this morning.  Patient states that she woke up this morning initially felt her normal self.  She stated that she started to feel dizzy like she was going to pass out but never actually passed out.  She denies it feeling like the room was spinning but more of a lightheadedness feeling.  She reports having a cold sensation over her body.  She reports that the symptoms have mostly resolved although she still feels a little bit of feeling off although she is hard to explain that.  She denies any new weakness, numbness or other concerns.  She denies any chest pain, shortness of breath.  She reports having a prior episode of this previously that she was seen here for.   Physical Exam   Triage Vital Signs: ED Triage Vitals  Enc Vitals Group     BP 03/25/22 1117 (!) 162/84     Pulse Rate 03/25/22 1117 94     Resp 03/25/22 1117 18     Temp 03/25/22 1117 98.4 F (36.9 C)     Temp Source 03/25/22 1117 Oral     SpO2 03/25/22 1117 97 %     Weight 03/25/22 1113 129 lb (58.5 kg)     Height 03/25/22 1113 5' 1.5" (1.562 m)     Head Circumference --      Peak Flow --      Pain Score 03/25/22 1113 0     Pain Loc --      Pain Edu? --      Excl. in [place]? --     Most recent vital signs: Vitals:   03/25/22 1117  BP: (!) 162/84  Pulse: 94  Resp: 18  Temp: 98.4 F (36.9 C)  SpO2: 97%     General: Awake, no distress.  CV:  Good peripheral perfusion.  Resp:  Normal effort.  Abd:  No distention.  Soft nontender Other:  No swelling in legs.  No calf tenderness cranial nerves appear intact.  Finger-to-nose intact   ED Results / Procedures / Treatments   Labs (all labs ordered are listed, but only abnormal results are displayed) Labs Reviewed   BASIC METABOLIC PANEL - Abnormal; Notable for the following components:      Result Value   Potassium 3.4 (*)    Glucose, Bld 114 (*)    All other components within normal limits  URINALYSIS, ROUTINE W REFLEX MICROSCOPIC - Abnormal; Notable for the following components:   Color, Urine YELLOW (*)    APPearance CLOUDY (*)    All other components within normal limits  HEPATIC FUNCTION PANEL - Abnormal; Notable for the following components:   Total Protein 8.5 (*)    Indirect Bilirubin 1.0 (*)    All other components within normal limits  RESP PANEL BY RT-PCR (RSV, FLU A&B, COVID)  [id number]  CBC  D-DIMER, QUANTITATIVE  CBG MONITORING, ED  TROPONIN I (HIGH SENSITIVITY)     EKG  My interpretation of EKG:  Normal sinus rate of 84, no ST elevation but does have some slight depression and T wave inversions in the inferior lateral leads, normal intervals  Repeat EKG continues to have a T wave inversion in lead III but no other  inversions seem to be better, no ST elevation, normal intervals  RADIOLOGY I have reviewed the CT head personally interpreted no evidence of intracranial hemorrhage  PROCEDURES:  Critical Care performed: No  .1-3 Lead EKG Interpretation  Performed by: Vanessa Venetie, MD Authorized by: Vanessa Elsa, MD     Interpretation: normal     ECG rate:  90   ECG rate assessment: normal     Rhythm: sinus rhythm     Ectopy: none     Conduction: normal      MEDICATIONS ORDERED IN ED: Medications - No data to display   IMPRESSION / MDM / [place] / ED COURSE  I reviewed the triage vital signs and the nursing notes.   Patient's presentation is most consistent with acute presentation with potential threat to life or bodily function.   Patient comes in with concerns for episode of near syncope.  Initial EKG was somewhat worrisome with new ST depressions and T wave inversions when I did repeated these depressions seem to have resolved and T wave  inversions seem to have mostly resolved except for 1.  And this T wave inversion looks similar to prior EKGs.  She denies any chest pain, shortness of breath.  Will get cardiac markers to evaluate, Electra abnormalities, D-dimer to evaluate for PE although she denies feeling short of breath.  Given it was more feeling like you are syncopal does not really sound like a posterior stroke.  But did give a CT head just to ensure no evidence of intercranial hemorrhage.  COVID, flu were negative.  Hepatic function reassuring troponin is negative D-dimer is negative BMP slightly low potassium CBC reassuring urine without evidence of UTI.  1:56 PM reevaluated patient discussed admission with my concern for abnormal EKG and near syncopal symptoms.  She reports resolution of symptoms.  We discussed the risk for death, cardiac involvement but patient seems like she would want to go home.  A repeat troponin is pending.  Repeat troponin is negative.  Patient declines admission.  Patient stable for discharge home patient is ambulatory without any issues.  On repeat evaluation she continues deny any current symptoms and feels at her baseline self     The patient is on the cardiac monitor to evaluate for evidence of arrhythmia and/or significant heart rate changes.      FINAL CLINICAL IMPRESSION(S) / ED DIAGNOSES   Final diagnoses:  Near syncope     Rx / DC Orders   ED Discharge Orders          Ordered    Ambulatory referral to Cardiology        03/25/22 1514             Note:  This document was prepared using Dragon voice recognition software and may include unintentional dictation errors.   Vanessa [place], MD 03/25/22 1515    Vanessa [place], MD 03/25/22 1525    Vanessa [place] , MD 03/25/22 [phone]

## 2022-03-28 NOTE — Progress Notes (Signed)
Cardiology Office Note:   Date:  03/30/2022  NAME:  Kathy Morgan    MRN: [id number] DOB:  1934-05-07   PCP:  Cletis Athens, MD  Cardiologist:  None  Electrophysiologist:  None   Referring MD: Vanessa [place], MD   Chief Complaint  Patient presents with   Dizziness        History of Present Illness:   Kathy Morgan is a 88 y.o. female with a hx of HTN who is being seen today for the evaluation of dizziness at the request of Cletis Athens, MD. Seen in the ER 3/25 for dizziness. Work-up negative. CT head negative.   She reports she was seen in the emergency room.  Had a one-time episode of dizziness.  Described lightness in her head.  Symptoms occur while sitting.  She reports they have resolved.  No further episodes.  Workup in the emergency room was negative.  Her medical history significant for hypertension.  BP 153/69.  She has had no recurrence of symptoms.  She has never had a heart attack or stroke.  All of her lab work last year looks within limits.  CV exam normal.  EKG shows changes of LVH.  She is a never smoker.  No alcohol or drug use.  She overall seems to be doing quite well.  Orthostatic vitals were checked.  BP was sitting 151/65.  BP with standing 138/76.  Heart rate remained stable 75-82.  She is widowed.  She has 1 son and 1 grandchild.  Denies any further episodes of dizziness.  She only drinks 2 glasses of water per day.  No excess caffeine.  We discussed proper hydration and eating well.  Past Medical History: Past Medical History:  Diagnosis Date   Arthritis    lower back   Hyperlipidemia    Hypertension    Vertigo    Wears dentures    full upper    Past Surgical History: Past Surgical History:  Procedure Laterality Date   BREAST SURGERY Right 01/01/2009   R cyst removal (benign)    CATARACT EXTRACTION W/PHACO Right 01/05/2019   Procedure: CATARACT EXTRACTION PHACO AND INTRAOCULAR LENS PLACEMENT (IOC) RIGHT 2.88  00:29.7;  Surgeon: Eulogio Bear,  MD;  Location: [facility];  Service: Ophthalmology;  Laterality: Right;   CATARACT EXTRACTION W/PHACO Left 02/23/2019   Procedure: CATARACT EXTRACTION PHACO AND INTRAOCULAR LENS PLACEMENT (IOC) LEFT 4.68  00:41.3;  Surgeon: Eulogio Bear, MD;  Location: [facility];  Service: Ophthalmology;  Laterality: Left;  needs to stay 2nd   VAGINAL HYSTERECTOMY  01/01/1989    Current Medications: Current Meds  Medication Sig   amLODipine (NORVASC) 5 MG tablet Take 1 tablet (5 mg total) by mouth daily.   atorvastatin (LIPITOR) 10 MG tablet TAKE 1 TABLET DAILY   cholecalciferol (VITAMIN D) 400 UNITS TABS tablet Take 1,000 Units by mouth daily.   KLOR-CON M20 20 MEQ tablet TAKE 1 TABLET BY MOUTH EVERY DAY   metoprolol succinate (TOPROL-XL) 50 MG 24 hr tablet Take 1 tablet (50 mg total) by mouth daily. Take with or immediately following a meal.     Allergies:    Patient has no known allergies.   Social History: Social History   Socioeconomic History   Marital status: Widowed    Spouse name: Not on file   Number of children: 1   Years of education: Not on file   Highest education level: Not on file  Occupational History   Occupation: Network engineer  [employer]  Tobacco Use   Smoking status: Never   Smokeless tobacco: Never  Vaping Use   Vaping Use: Never used  Substance and Sexual Activity   Alcohol use: Yes    Alcohol/week: 2.0 standard drinks of alcohol    Types: 2 Glasses of wine per week   Drug use: No   Sexual activity: Never  Other Topics Concern   Not on file  Social History Narrative   Not on file   Social Determinants of Health   Financial Resource Strain: Low Risk  (09/02/2020)   Overall Financial Resource Strain (CARDIA)    Difficulty of Paying Living Expenses: Not hard at all  Food Insecurity: No Food Insecurity (09/02/2020)   Hunger Vital Sign    Worried About Running Out of Food in the Last Year: Never true    Ran Out of Food in the Last Year: Never  true  Transportation Needs: No Transportation Needs (09/02/2020)   PRAPARE - Hydrologist (Medical): No    Lack of Transportation (Non-Medical): No  Physical Activity: Insufficiently Active (09/02/2020)   Exercise Vital Sign    Days of Exercise per Week: 2 days    Minutes of Exercise per Session: 20 min  Stress: No Stress Concern Present (09/02/2020)   [place]    Feeling of Stress : Not at all  Social Connections: Moderately Integrated (09/02/2020)   Social Connection and Isolation Panel [NHANES]    Frequency of Communication with Friends and Family: More than three times a week    Frequency of Social Gatherings with Friends and Family: More than three times a week    Attends Religious Services: More than 4 times per year    Active Member of [employer] or Organizations: Yes    Attends Archivist Meetings: More than 4 times per year    Marital Status: Widowed     Family History: The patient's family history includes Cancer in her mother; Dementia in her father.  ROS:   All other ROS reviewed and negative. Pertinent positives noted in the HPI.     EKGs/Labs/Other Studies Reviewed:   The following studies were personally reviewed by me today:  EKG:  EKG is ordered today.  The ekg ordered today demonstrates normal sinus rhythm heart rate 78, LVH by voltage, inferior T wave inversions, and was personally reviewed by me.   Recent Labs: 03/25/2022: ALT 22; BUN 15; Creatinine, Ser 0.80; Hemoglobin 14.3; Platelets 157; Potassium 3.4; Sodium 136   Recent Lipid Panel    Component Value Date/Time   CHOL 183 01/10/2021 1115   TRIG 61 01/10/2021 1115   HDL 75 01/10/2021 1115   CHOLHDL 2.4 01/10/2021 1115   VLDL 9 11/06/2018 1239   LDLCALC 93 01/10/2021 1115    Physical Exam:   VS:  BP (!) 153/69 (BP Location: Left Arm, Patient Position: Sitting, Cuff Size: Normal)   Pulse 78   Ht 5' 1.5"  (1.562 m)   Wt 130 lb 12.8 oz (59.3 kg)   BMI 24.31 kg/m    Wt Readings from Last 3 Encounters:  03/30/22 130 lb 12.8 oz (59.3 kg)  03/25/22 129 lb (58.5 kg)  02/16/22 131 lb 2.8 oz (59.5 kg)    General: Well nourished, well developed, in no acute distress Head: Atraumatic, normal size  Eyes: PEERLA, EOMI  Neck: Supple, no JVD Endocrine: No thryomegaly Cardiac: Normal S1, S2; RRR; no murmurs, rubs, or  gallops Lungs: Clear to auscultation bilaterally, no wheezing, rhonchi or rales  Abd: Soft, nontender, no hepatomegaly  Ext: No edema, pulses 2+ Musculoskeletal: No deformities, BUE and BLE strength normal and equal Skin: Warm and dry, no rashes   Neuro: Alert and oriented to person, place, time, and situation, CNII-XII grossly intact, no focal deficits  Psych: Normal mood and affect   ASSESSMENT:   Kaydie Ivins is a 88 y.o. female who presents for the following: 1. Dizziness   2. Primary hypertension     PLAN:   1. Dizziness -One-time episode of dizziness.  She is not drinking enough water.  CV exam normal.  EKG likely with just LVH.  No further recurrence of symptoms.  I recommended regular hydration as well as regular exercise.  We will check an echo just to make sure everything looks okay there.  She describes no chest pain or shortness of breath.  She overall seems to be quite healthy.  She will see [place] back as needed based on her echocardiogram.  She will follow-up with her primary care physician for chronic medical issues.  2. Primary hypertension -No change to medications.  Instructed to increase hydration.  She will see [place] back as needed.  EKG likely abnormal due to LVH from hypertension.  Follow-up echo.      Disposition: Return if symptoms worsen or fail to improve.  Medication Adjustments/Labs and Tests Ordered: Current medicines are reviewed at length with the patient today.  Concerns regarding medicines are outlined above.  Orders Placed This Encounter   Procedures   EKG 12-Lead   ECHOCARDIOGRAM COMPLETE   No orders of the defined types were placed in this encounter.   Patient Instructions  Medication Instructions:  Your physician recommends that you continue on your current medications as directed. Please refer to the Current Medication list given to you today.  *If you need a refill on your cardiac medications before your next appointment, please call your pharmacy*  Testing/Procedures: Your physician has requested that you have an echocardiogram. Echocardiography is a painless test that uses sound waves to create images of your heart. It provides your doctor with information about the size and shape of your heart and how well your heart's chambers and valves are working. This procedure takes approximately one hour. There are no restrictions for this procedure. Please do NOT wear cologne, perfume, aftershave, or lotions (deodorant is allowed). Please arrive 15 minutes prior to your appointment time.  Follow-Up: At [facility], you and your health needs are our priority.  As part of our continuing mission to provide you with exceptional heart care, we have created designated Provider Care Teams.  These Care Teams include your primary Cardiologist (physician) and Advanced Practice Providers (APPs -  Physician Assistants and Nurse Practitioners) who all work together to provide you with the care you need, when you need it.  We recommend signing up for the patient portal called "MyChart".  Sign up information is provided on this After Visit Summary.  MyChart is used to connect with patients for Virtual Visits (Telemedicine).  Patients are able to view lab/test results, encounter notes, upcoming appointments, etc.  Non-urgent messages can be sent to your provider as well.   To learn more about what you can do with MyChart, go to [email or web address].    Your next appointment:   As needed     Signed, [place] Bells T. Audie Box, MD,  [place]  [address] [place],  [place] [address] [phone]  03/30/2022 1:51 PM

## 2022-03-30 ENCOUNTER — Encounter (HOSPITAL_BASED_OUTPATIENT_CLINIC_OR_DEPARTMENT_OTHER): Payer: Self-pay | Admitting: Cardiovascular Disease

## 2022-03-30 ENCOUNTER — Ambulatory Visit (INDEPENDENT_AMBULATORY_CARE_PROVIDER_SITE_OTHER): Payer: Medicare Other | Admitting: Cardiovascular Disease

## 2022-03-30 VITALS — BP 153/69 | HR 78 | Ht 61.5 in | Wt 130.8 lb

## 2022-03-30 DIAGNOSIS — R42 Dizziness and giddiness: Secondary | ICD-10-CM

## 2022-03-30 DIAGNOSIS — I10 Essential (primary) hypertension: Secondary | ICD-10-CM | POA: Diagnosis not present

## 2022-03-30 NOTE — Patient Instructions (Signed)
Medication Instructions:  Your physician recommends that you continue on your current medications as directed. Please refer to the Current Medication list given to you today.  *If you need a refill on your cardiac medications before your next appointment, please call your pharmacy*  Testing/Procedures: Your physician has requested that you have an echocardiogram. Echocardiography is a painless test that uses sound waves to create images of your heart. It provides your doctor with information about the size and shape of your heart and how well your heart's chambers and valves are working. This procedure takes approximately one hour. There are no restrictions for this procedure. Please do NOT wear cologne, perfume, aftershave, or lotions (deodorant is allowed). Please arrive 15 minutes prior to your appointment time.  Follow-Up: At [facility], you and your health needs are our priority.  As part of our continuing mission to provide you with exceptional heart care, we have created designated Provider Care Teams.  These Care Teams include your primary Cardiologist (physician) and Advanced Practice Providers (APPs -  Physician Assistants and Nurse Practitioners) who all work together to provide you with the care you need, when you need it.  We recommend signing up for the patient portal called "MyChart".  Sign up information is provided on this After Visit Summary.  MyChart is used to connect with patients for Virtual Visits (Telemedicine).  Patients are able to view lab/test results, encounter notes, upcoming appointments, etc.  Non-urgent messages can be sent to your provider as well.   To learn more about what you can do with MyChart, go to [email or web address].    Your next appointment:   As needed

## 2022-05-03 ENCOUNTER — Other Ambulatory Visit (HOSPITAL_BASED_OUTPATIENT_CLINIC_OR_DEPARTMENT_OTHER): Payer: Self-pay | Admitting: Cardiovascular Disease

## 2022-05-03 DIAGNOSIS — R42 Dizziness and giddiness: Secondary | ICD-10-CM

## 2022-05-03 DIAGNOSIS — I10 Essential (primary) hypertension: Secondary | ICD-10-CM

## 2022-05-09 ENCOUNTER — Ambulatory Visit: Payer: Medicare Other | Attending: Cardiovascular Disease

## 2022-05-09 DIAGNOSIS — R55 Syncope and collapse: Secondary | ICD-10-CM | POA: Insufficient documentation

## 2022-05-09 DIAGNOSIS — I082 Rheumatic disorders of both aortic and tricuspid valves: Secondary | ICD-10-CM | POA: Diagnosis not present

## 2022-05-09 DIAGNOSIS — I5030 Unspecified diastolic (congestive) heart failure: Secondary | ICD-10-CM | POA: Diagnosis not present

## 2022-05-09 DIAGNOSIS — I10 Essential (primary) hypertension: Secondary | ICD-10-CM | POA: Diagnosis not present

## 2022-05-09 DIAGNOSIS — E785 Hyperlipidemia, unspecified: Secondary | ICD-10-CM | POA: Insufficient documentation

## 2022-05-09 DIAGNOSIS — R42 Dizziness and giddiness: Secondary | ICD-10-CM | POA: Diagnosis present

## 2022-05-09 LAB — ECHOCARDIOGRAM COMPLETE
AR max vel: 1.26 cm2
AV Area VTI: 1.13 cm2
AV Area mean vel: 1.13 cm2
AV Mean grad: 2 mmHg
AV Peak grad: 3.5 mmHg
Ao pk vel: 0.94 m/s
Area-P 1/2: 3.31 cm2
S' Lateral: 1.7 cm

## 2022-05-10 ENCOUNTER — Telehealth: Payer: Self-pay | Admitting: Cardiovascular Disease

## 2022-05-10 NOTE — Telephone Encounter (Signed)
Gave patient echo results.  She is very happy.

## 2022-05-10 NOTE — Telephone Encounter (Signed)
Calling to go over her results. Please advise  

## 2022-05-23 NOTE — Progress Notes (Signed)
Covid test ordered to evaluate for cause of dizziness in elderly person.

## 2022-07-26 ENCOUNTER — Ambulatory Visit (INDEPENDENT_AMBULATORY_CARE_PROVIDER_SITE_OTHER): Payer: Medicare Other | Admitting: Podiatry

## 2022-07-26 DIAGNOSIS — M778 Other enthesopathies, not elsewhere classified: Secondary | ICD-10-CM | POA: Diagnosis not present

## 2022-07-26 DIAGNOSIS — M19072 Primary osteoarthritis, left ankle and foot: Secondary | ICD-10-CM

## 2022-07-26 NOTE — Progress Notes (Signed)
  Subjective:  Patient ID: Kathy Morgan, female    DOB: 10-19-1934,  MRN: [id number]  No chief complaint on file.   88 y.o. female presents with the above complaint.  Patient was also followed on dorsal midfoot pain/arthritic pain issue she states that it started acting up again.  She would like another injection she denies any other acute complaints  Review of Systems: Negative except as noted in the HPI. Denies N/V/F/Ch.  Past Medical History:  Diagnosis Date   Arthritis    lower back   Hyperlipidemia    Hypertension    Vertigo    Wears dentures    full upper    Current Outpatient Medications:    amLODipine (NORVASC) 5 MG tablet, Take 1 tablet (5 mg total) by mouth daily., Disp: 90 tablet, Rfl: 3   atorvastatin (LIPITOR) 10 MG tablet, TAKE 1 TABLET DAILY, Disp: 90 tablet, Rfl: 3   cholecalciferol (VITAMIN D) 400 UNITS TABS tablet, Take 1,000 Units by mouth daily., Disp: , Rfl:    KLOR-CON M20 20 MEQ tablet, TAKE 1 TABLET BY MOUTH EVERY DAY, Disp: 90 tablet, Rfl: 1   metoprolol succinate (TOPROL-XL) 50 MG 24 hr tablet, Take 1 tablet (50 mg total) by mouth daily. Take with or immediately following a meal., Disp: 90 tablet, Rfl: 3  Social History   Tobacco Use  Smoking Status Never  Smokeless Tobacco Never    No Known Allergies Objective:  There were no vitals filed for this visit. There is no height or weight on file to calculate BMI. Constitutional Well developed. Well nourished.  Vascular Dorsalis pedis pulses palpable bilaterally. Posterior tibial pulses palpable bilaterally. Capillary refill normal to all digits.  No cyanosis or clubbing noted. Pedal hair growth normal.  Neurologic Normal speech. Oriented to person, place, and time. Epicritic sensation to light touch grossly present bilaterally.  Dermatologic Nails well groomed and normal in appearance. No open wounds. No skin lesions.  Orthopedic: Pain on palpation to the left dorsal midfoot.  Pain at the  third fourth and fifth tarsometatarsal joint.  Pain with range of motion of the joints.  No pain at the metatarsophalangeal joints.  No extensor or flexor tendinitis noted.   Radiographs: 3 views of skeletally mature adult left foot: Osteoarthritic changes noted of the left first metatarsophalangeal joint severe in nature.  Severe osteoarthritic changes noted of the midfoot joints as well.  Flatfoot foot structure noted.  No other bony abnormalities identified Assessment:   No diagnosis found.       Plan:  Patient was evaluated and treated and all questions answered.  Left dorsal midfoot arthritis -I explained the patient the etiology of arthritis and various treatment options were discussed.  Given the amount of pain she is having I believe she will benefit from a steroid injection to help decrease acute inflammatory component associated pain.  Patient agrees with plan like to proceed with a steroid injection. -Another steroid injection was performed at left dorsal midfoot at point of maximal tenderness using 1% plain Lidocaine and 10 mg of Kenalog. This was well tolerated. -Continue using Voltaren gel over-the-counter.  Right second mild toe contusion  -Clinically healed  No follow-ups on file.

## 2022-09-15 ENCOUNTER — Other Ambulatory Visit: Payer: Self-pay | Admitting: Internal Medicine

## 2022-09-15 DIAGNOSIS — I10 Essential (primary) hypertension: Secondary | ICD-10-CM

## 2022-12-23 ENCOUNTER — Other Ambulatory Visit: Payer: Self-pay | Admitting: Internal Medicine

## 2022-12-23 DIAGNOSIS — I10 Essential (primary) hypertension: Secondary | ICD-10-CM

## 2022-12-23 IMAGING — CR DG CHEST 2V
1 series · 2 of 2 positions shown · non-contrast
Comparison: 12/17/2012 chest radiograph.

CLINICAL DATA: Chest pain

EXAM:
CHEST - 2 VIEW

[Series 1: dg chest 2 view · 0.14mm/px · 2 of 2 slices shown]
[im 1/2]
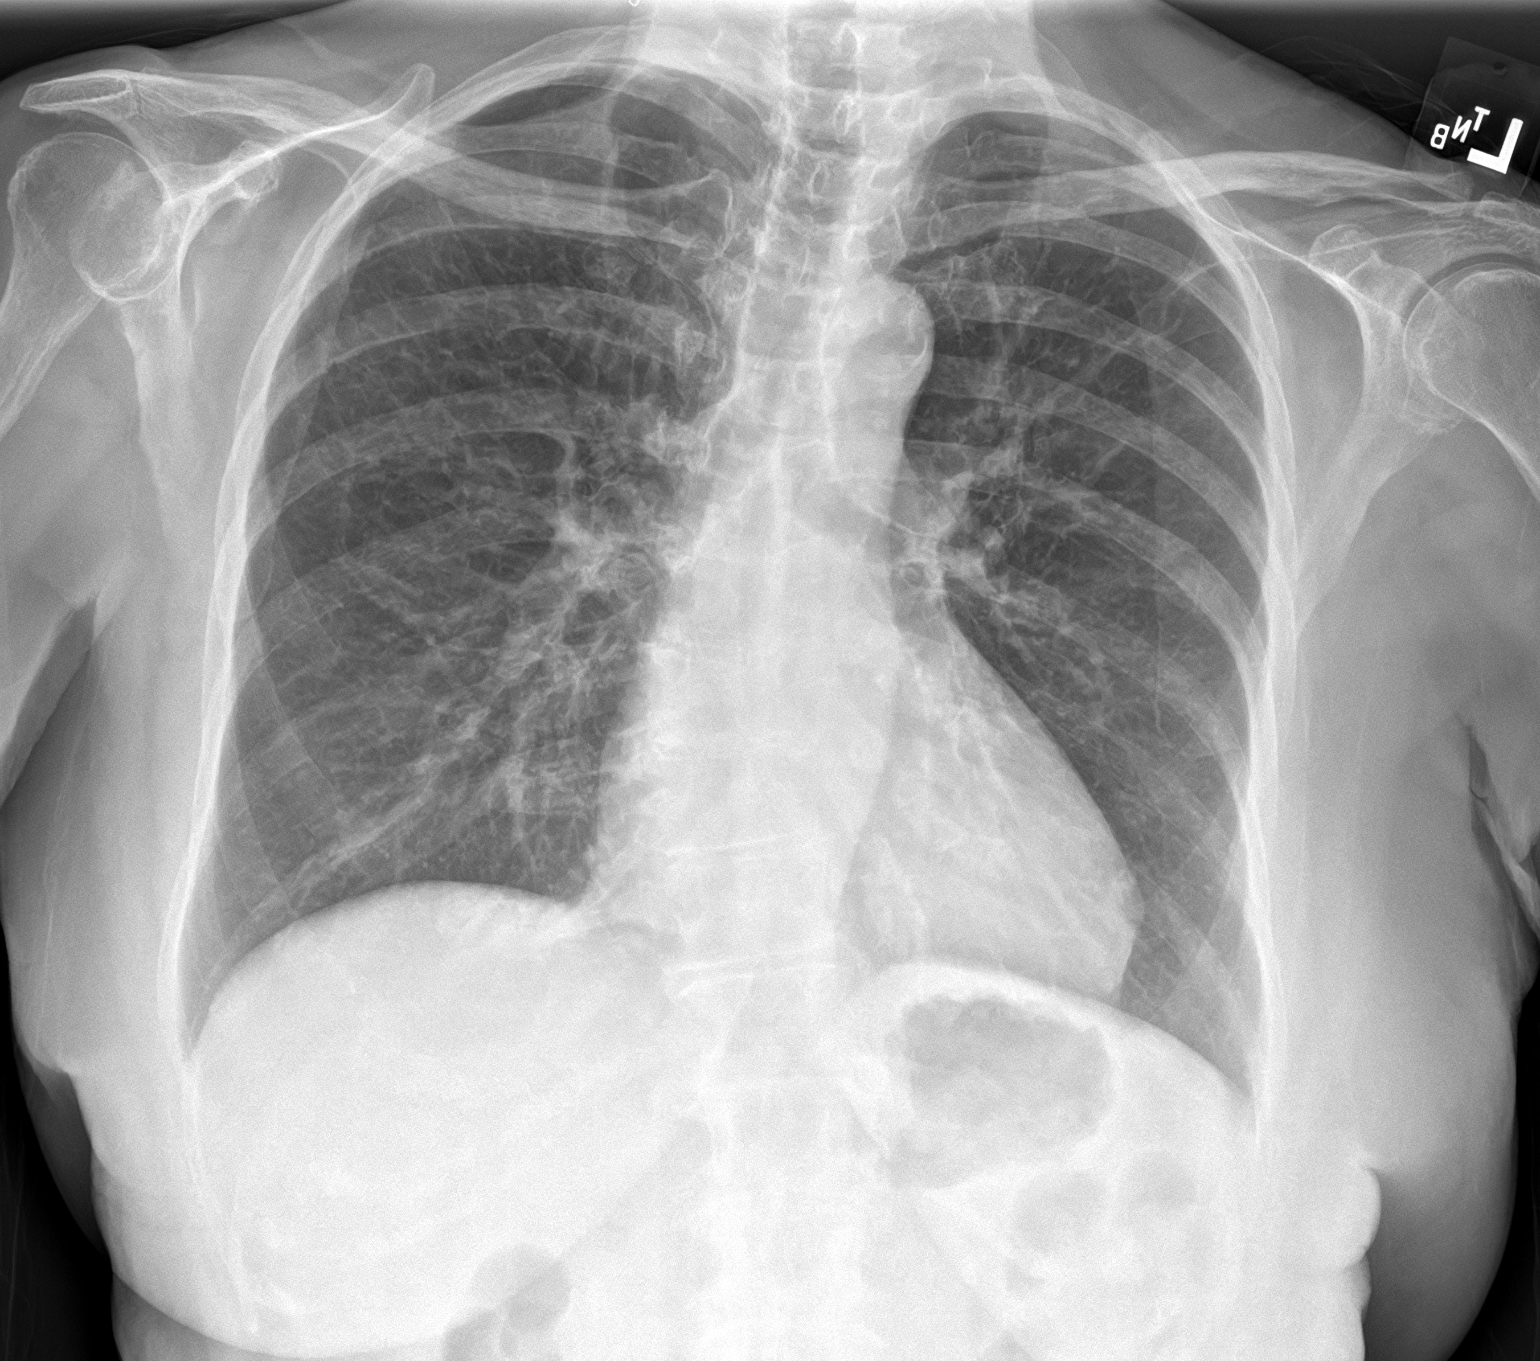
[im 2/2]
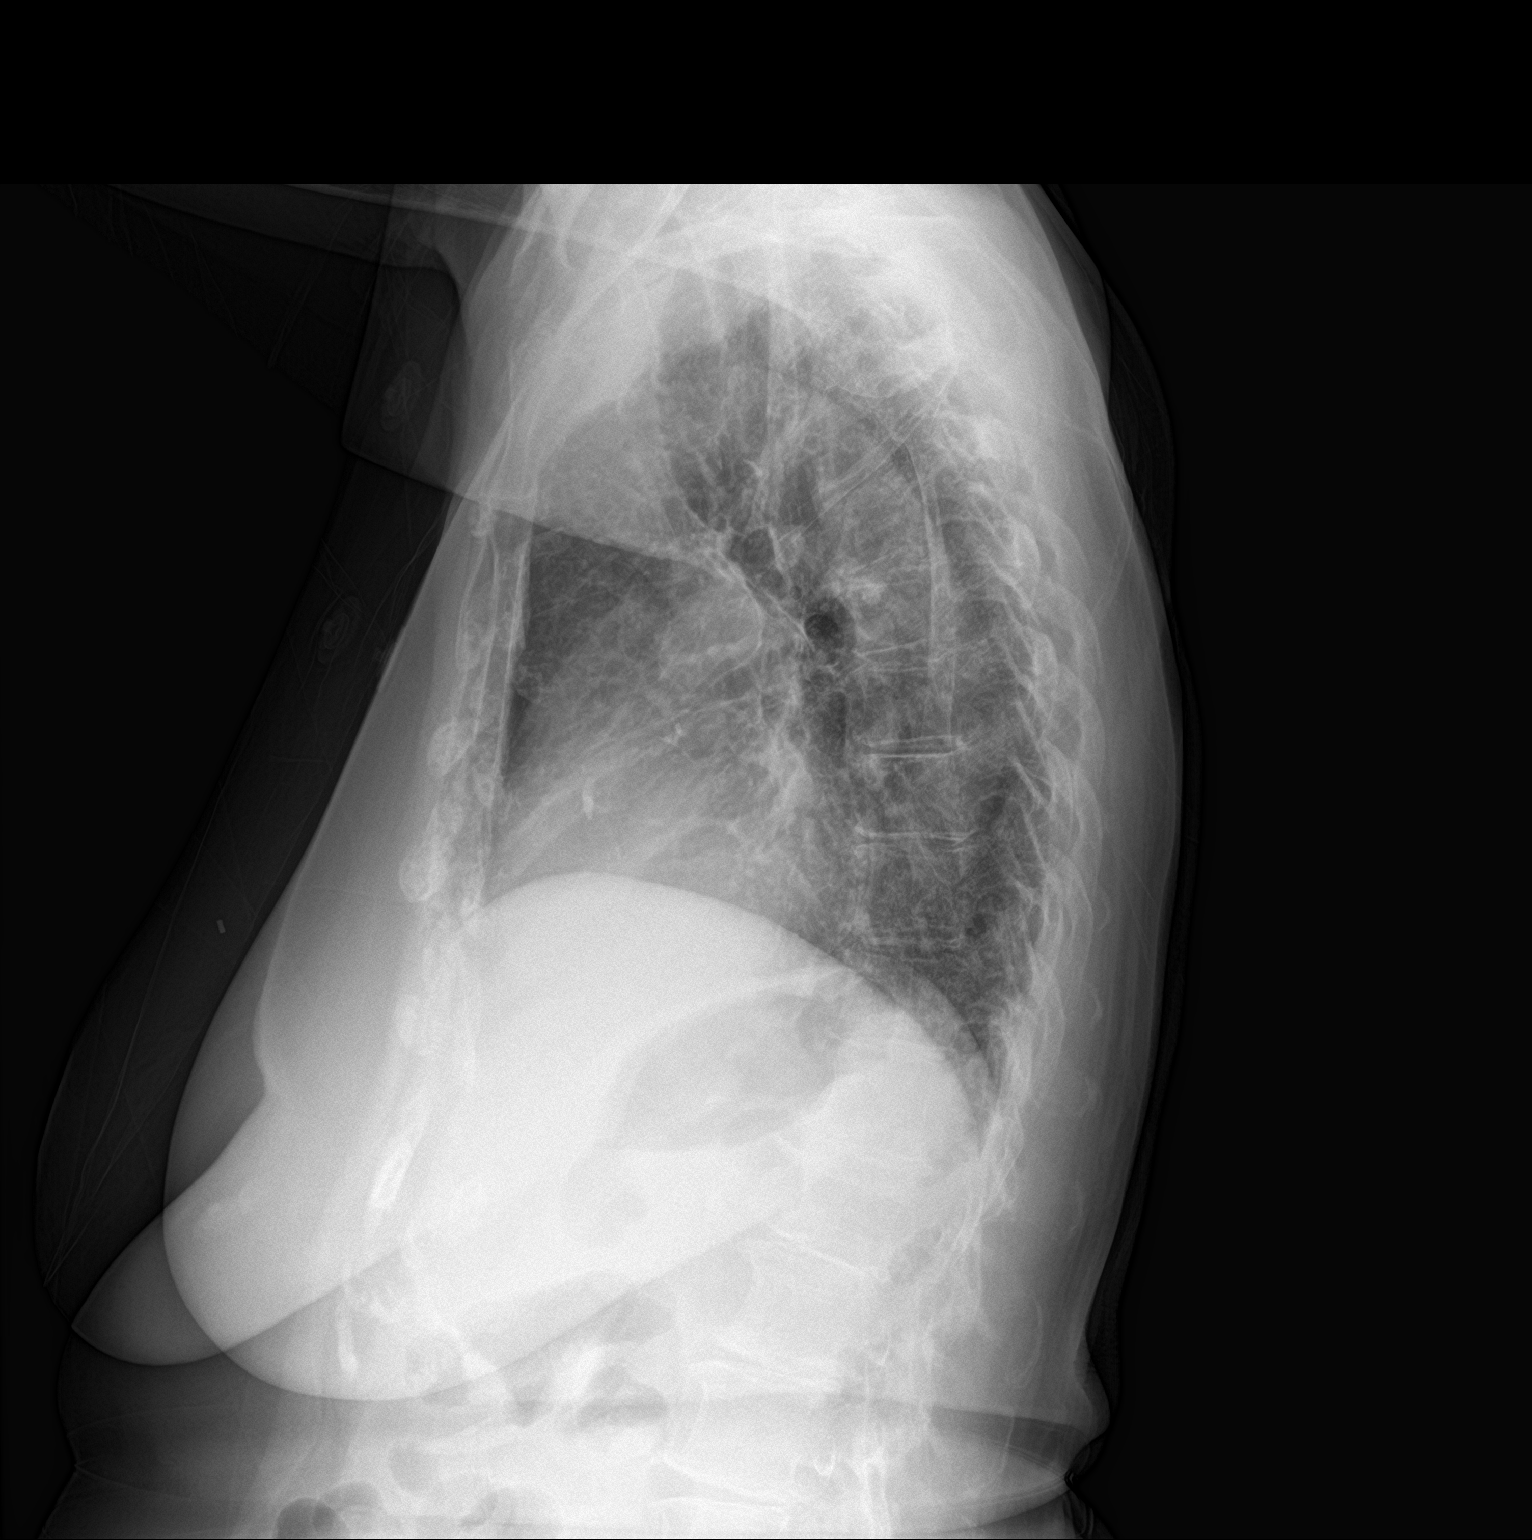

[2 of 2 positions shown; findings below may reference images not displayed]

FINDINGS: Stable cardiomediastinal silhouette with normal heart size. No
pneumothorax. No pleural effusion. Lungs appear clear, with no acute
consolidative airspace disease and no pulmonary edema.
IMPRESSION: No active cardiopulmonary disease.

## 2023-01-03 ENCOUNTER — Emergency Department: Payer: Medicare Other

## 2023-01-03 ENCOUNTER — Encounter: Payer: Self-pay | Admitting: Emergency Medicine

## 2023-01-03 ENCOUNTER — Emergency Department
Admission: EM | Admit: 2023-01-03 | Discharge: 2023-01-03 | Disposition: A | Discharge status: Home / Self Care | Payer: Medicare Other | Attending: Emergency Medicine | Admitting: Emergency Medicine

## 2023-01-03 ENCOUNTER — Other Ambulatory Visit: Payer: Self-pay

## 2023-01-03 DIAGNOSIS — R9431 Abnormal electrocardiogram [ECG] [EKG]: Secondary | ICD-10-CM | POA: Diagnosis not present

## 2023-01-03 DIAGNOSIS — R6883 Chills (without fever): Secondary | ICD-10-CM | POA: Diagnosis present

## 2023-01-03 DIAGNOSIS — R059 Cough, unspecified: Secondary | ICD-10-CM | POA: Diagnosis not present

## 2023-01-03 DIAGNOSIS — I119 Hypertensive heart disease without heart failure: Secondary | ICD-10-CM | POA: Insufficient documentation

## 2023-01-03 DIAGNOSIS — Z20822 Contact with and (suspected) exposure to covid-19: Secondary | ICD-10-CM | POA: Insufficient documentation

## 2023-01-03 DIAGNOSIS — I700 Atherosclerosis of aorta: Secondary | ICD-10-CM | POA: Insufficient documentation

## 2023-01-03 DIAGNOSIS — I10 Essential (primary) hypertension: Secondary | ICD-10-CM | POA: Insufficient documentation

## 2023-01-03 LAB — CBC WITH DIFFERENTIAL/PLATELET
Abs Immature Granulocytes: 0.04 10*3/uL (ref 0.00–0.07)
Basophils Absolute: 0 10*3/uL (ref 0.0–0.1)
Basophils Relative: 1 %
Eosinophils Absolute: 0.2 10*3/uL (ref 0.0–0.5)
Eosinophils Relative: 2 %
HCT: 44.9 % (ref 36.0–46.0)
Hemoglobin: 15.1 g/dL — ABNORMAL HIGH (ref 12.0–15.0)
Immature Granulocytes: 1 %
Lymphocytes Relative: 31 %
Lymphs Abs: 2 10*3/uL (ref 0.7–4.0)
MCH: 31.4 pg (ref 26.0–34.0)
MCHC: 33.6 g/dL (ref 30.0–36.0)
MCV: 93.3 fL (ref 80.0–100.0)
Monocytes Absolute: 0.5 10*3/uL (ref 0.1–1.0)
Monocytes Relative: 8 %
Neutro Abs: 3.7 10*3/uL (ref 1.7–7.7)
Neutrophils Relative %: 57 %
Platelets: 156 10*3/uL (ref 150–400)
RBC: 4.81 MIL/uL (ref 3.87–5.11)
RDW: 13.7 % (ref 11.5–15.5)
WBC: 6.4 10*3/uL (ref 4.0–10.5)
nRBC: 0 % (ref 0.0–0.2)

## 2023-01-03 LAB — COMPREHENSIVE METABOLIC PANEL
ALT: 22 U/L (ref 0–44)
AST: 31 U/L (ref 15–41)
Albumin: 4.5 g/dL (ref 3.5–5.0)
Alkaline Phosphatase: 71 U/L (ref 38–126)
Anion gap: 13 (ref 5–15)
BUN: 15 mg/dL (ref 8–23)
CO2: 22 mmol/L (ref 22–32)
Calcium: 9.2 mg/dL (ref 8.9–10.3)
Chloride: 103 mmol/L (ref 98–111)
Creatinine, Ser: 0.81 mg/dL (ref 0.44–1.00)
GFR, Estimated: >60 mL/min (ref >60)
Glucose, Bld: 99 mg/dL (ref 70–99)
Potassium: 4.7 mmol/L (ref 3.5–5.1)
Sodium: 138 mmol/L (ref 135–145)
Total Bilirubin: 1.4 mg/dL — ABNORMAL HIGH (ref 0.0–1.2)
Total Protein: 8.8 g/dL — ABNORMAL HIGH (ref 6.5–8.1)

## 2023-01-03 LAB — URINALYSIS, ROUTINE W REFLEX MICROSCOPIC
Bilirubin Urine: NEGATIVE
Glucose, UA: NEGATIVE mg/dL
Hgb urine dipstick: NEGATIVE
Ketones, ur: NEGATIVE mg/dL
Leukocytes,Ua: NEGATIVE
Nitrite: NEGATIVE
Protein, ur: NEGATIVE mg/dL
Specific Gravity, Urine: 1.009 (ref 1.005–1.030)
pH: 5 (ref 5.0–8.0)

## 2023-01-03 LAB — RESP PANEL BY RT-PCR (RSV, FLU A&B, COVID)  RVPGX2
Influenza A by PCR: NEGATIVE
Influenza B by PCR: NEGATIVE
Resp Syncytial Virus by PCR: NEGATIVE
SARS Coronavirus 2 by RT PCR: NEGATIVE

## 2023-01-03 LAB — TROPONIN I (HIGH SENSITIVITY): Troponin I (High Sensitivity): 4 ng/L (ref <18)

## 2023-01-03 NOTE — ED Triage Notes (Signed)
 Patient to ED via POV for chills that started this morning. States she feels a little anxious. Denies any other symptoms. Ambulatory to triage.  Hx HTN

## 2023-01-03 NOTE — ED Notes (Signed)
 See triage notes. Patient c/o body chills and anxiety.

## 2023-01-03 NOTE — ED Provider Notes (Signed)
 [facility] Provider Note    Event Date/Time   First MD Initiated Contact with Patient 01/03/23 1252     (approximate)   History   Chills   HPI  Kathy Morgan is a 89 y.o. female with history of hypertension, hyperlipidemia, and as listed in EMR presents to the emergency department for treatment and evaluation of intermittent cough and 3 days of feeling like she cannot get warm.  No specific pain.  No dizziness.  No medication changes.  No known bloody stools.  No known exposures to viral illness. She has never felt this way before.       Physical Exam   Triage Vital Signs: ED Triage Vitals [01/03/23 1122]  Encounter Vitals Group     BP (!) 167/82     Systolic BP Percentile      Diastolic BP Percentile      Pulse Rate (!) 102     Resp 18     Temp 98.6 F (37 C)     Temp Source Oral     SpO2 95 %     Weight 124 lb (56.2 kg)     Height 5' 1 (1.549 m)     Head Circumference      Peak Flow      Pain Score 0     Pain Loc      Pain Education      Exclude from Growth Chart     Most recent vital signs: Vitals:   01/03/23 1122  BP: (!) 167/82  Pulse: (!) 102  Resp: 18  Temp: 98.6 F (37 C)  SpO2: 95%    General: Awake, no distress.  CV:  Good peripheral perfusion.  Resp:  Normal effort. Breath sounds clear.  Abd:  No distention. Soft. Other:     ED Results / Procedures / Treatments   Labs (all labs ordered are listed, but only abnormal results are displayed) Labs Reviewed  CBC WITH DIFFERENTIAL/PLATELET - Abnormal; Notable for the following components:      Result Value   Hemoglobin 15.1 (*)    All other components within normal limits  COMPREHENSIVE METABOLIC PANEL - Abnormal; Notable for the following components:   Total Protein 8.8 (*)    Total Bilirubin 1.4 (*)    All other components within normal limits  URINALYSIS, ROUTINE W REFLEX MICROSCOPIC - Abnormal; Notable for the following components:   Color, Urine STRAW  (*)    APPearance CLEAR (*)    All other components within normal limits  RESP PANEL BY RT-PCR (RSV, FLU A&B, COVID)  RVPGX2  TROPONIN I (HIGH SENSITIVITY)     EKG  95, NSR   RADIOLOGY  Image and radiology report reviewed and interpreted by me. Radiology report consistent with the same.  Pending.  PROCEDURES:  Critical Care performed: No  Procedures   MEDICATIONS ORDERED IN ED:  Medications - No data to display   IMPRESSION / MDM / ASSESSMENT AND PLAN / ED COURSE   I have reviewed the triage note.  Differential diagnosis includes, but is not limited to, viral syndrome, anemia, cardiac event  Patient's presentation is most consistent with acute illness / injury with system symptoms.  89 year old female presenting to the emergency department for treatment and evaluation for generally feeling unwell.  See HPI for further details.  While awaiting ER room assignment, viral respiratory panel was obtained and all are negative.  Vital signs reviewed and she is slightly hypertensive and tachycardic.  No fever.  Respiratory rate and O2 sat are normal.  Plan will be to get some screening labs, chest x-ray, and EKG.   CBC, CMP, troponin and urinalysis are all reassuring.  Chest x-ray is pending.  Care transitioned to J. Poggi, PA-C who will follow to disposition after chest x-ray is read.   FINAL CLINICAL IMPRESSION(S) / ED DIAGNOSES   Final diagnoses:  Chills     Rx / DC Orders   ED Discharge Orders     None        Note:  This document was prepared using Dragon voice recognition software and may include unintentional dictation errors.   Herlinda Kirk NOVAK, FNP 01/04/23 [id number]    Levander Slate, MD 01/04/23 [phone]

## 2023-01-03 NOTE — Discharge Instructions (Addendum)
 You were evaluated in the ED for chills.  Your respiratory panel which includes COVID, influenza and RSV is negative.  Your chest x-ray is normal.  Your lab work is reassuring.  Everything evaluated and assessed today normal.  If symptoms continue and worsen please follow-up with your primary care.

## 2023-01-15 NOTE — Progress Notes (Signed)
 Presented with possible viral symptoms, viral panel ordered to further evaluate possible etiology.

## 2023-01-31 ENCOUNTER — Ambulatory Visit (INDEPENDENT_AMBULATORY_CARE_PROVIDER_SITE_OTHER): Payer: Medicare Other | Admitting: Podiatry

## 2023-01-31 DIAGNOSIS — M778 Other enthesopathies, not elsewhere classified: Secondary | ICD-10-CM | POA: Diagnosis not present

## 2023-01-31 NOTE — Progress Notes (Signed)
  Subjective:  Patient ID: Kathy Morgan, female    DOB: 09-Nov-1934,  MRN: [id number]  Chief Complaint  Patient presents with   Foot Pain    Injection for left foot- capsulitis AND ARTHRITIS     89 y.o. female presents with the above complaint.  Patient was also followed on dorsal midfoot pain/arthritic pain issue she states that injection always helps with the pain on the F she can tell she is here for another injection denies any other acute complaints  Review of Systems: Negative except as noted in the HPI. Denies N/V/F/Ch.  Past Medical History:  Diagnosis Date   Arthritis    lower back   Hyperlipidemia    Hypertension    Vertigo    Wears dentures    full upper    Current Outpatient Medications:    amLODipine (NORVASC) 5 MG tablet, Take 1 tablet (5 mg total) by mouth daily., Disp: 90 tablet, Rfl: 3   atorvastatin (LIPITOR) 10 MG tablet, TAKE 1 TABLET DAILY, Disp: 90 tablet, Rfl: 3   cholecalciferol (VITAMIN D) 400 UNITS TABS tablet, Take 1,000 Units by mouth daily., Disp: , Rfl:    KLOR-CON M20 20 MEQ tablet, TAKE 1 TABLET BY MOUTH EVERY DAY, Disp: 90 tablet, Rfl: 1   metoprolol succinate (TOPROL-XL) 50 MG 24 hr tablet, Take 1 tablet (50 mg total) by mouth daily. Take with or immediately following a meal., Disp: 90 tablet, Rfl: 3  Social History   Tobacco Use  Smoking Status Never  Smokeless Tobacco Never    No Known Allergies Objective:  There were no vitals filed for this visit. There is no height or weight on file to calculate BMI. Constitutional Well developed. Well nourished.  Vascular Dorsalis pedis pulses palpable bilaterally. Posterior tibial pulses palpable bilaterally. Capillary refill normal to all digits.  No cyanosis or clubbing noted. Pedal hair growth normal.  Neurologic Normal speech. Oriented to person, place, and time. Epicritic sensation to light touch grossly present bilaterally.  Dermatologic Nails well groomed and normal in appearance. No  open wounds. No skin lesions.  Orthopedic: Pain on palpation to the left dorsal midfoot.  Pain at the third fourth and fifth tarsometatarsal joint.  Pain with range of motion of the joints.  No pain at the metatarsophalangeal joints.  No extensor or flexor tendinitis noted.   Radiographs: 3 views of skeletally mature adult left foot: Osteoarthritic changes noted of the left first metatarsophalangeal joint severe in nature.  Severe osteoarthritic changes noted of the midfoot joints as well.  Flatfoot foot structure noted.  No other bony abnormalities identified Assessment:   1. Capsulitis of left foot          Plan:  Patient was evaluated and treated and all questions answered.  Left dorsal midfoot arthritis -I explained the patient the etiology of arthritis and various treatment options were discussed.  Given the amount of pain she is having I believe she will benefit from a steroid injection to help decrease acute inflammatory component associated pain.  Patient agrees with plan like to proceed with a steroid injection. -Another steroid injection was performed at left dorsal midfoot at point of maximal tenderness using 1% plain Lidocaine and 10 mg of Kenalog. This was well tolerated. -Continue using Voltaren gel over-the-counter.  Right second mild toe contusion  -Clinically healed  No follow-ups on file.

## 2023-05-21 ENCOUNTER — Ambulatory Visit (INDEPENDENT_AMBULATORY_CARE_PROVIDER_SITE_OTHER): Admitting: General Practice

## 2023-05-21 ENCOUNTER — Encounter: Payer: Self-pay | Admitting: General Practice

## 2023-05-21 VITALS — BP 122/70 | HR 83 | Temp 98.0°F | Ht 58.9 in | Wt 128.0 lb

## 2023-05-21 DIAGNOSIS — E7800 Pure hypercholesterolemia, unspecified: Secondary | ICD-10-CM | POA: Diagnosis not present

## 2023-05-21 DIAGNOSIS — I10 Essential (primary) hypertension: Secondary | ICD-10-CM | POA: Diagnosis not present

## 2023-05-21 DIAGNOSIS — E559 Vitamin D deficiency, unspecified: Secondary | ICD-10-CM | POA: Insufficient documentation

## 2023-05-21 DIAGNOSIS — Z7689 Persons encountering health services in other specified circumstances: Secondary | ICD-10-CM | POA: Insufficient documentation

## 2023-05-21 DIAGNOSIS — Z8639 Personal history of other endocrine, nutritional and metabolic disease: Secondary | ICD-10-CM | POA: Diagnosis not present

## 2023-05-21 DIAGNOSIS — Z780 Asymptomatic menopausal state: Secondary | ICD-10-CM

## 2023-05-21 LAB — CBC
HCT: 42.1 % (ref 36.0–46.0)
Hemoglobin: 14 g/dL (ref 12.0–15.0)
MCHC: 33.1 g/dL (ref 30.0–36.0)
MCV: 92.9 fl (ref 78.0–100.0)
Platelets: 166 10*3/uL (ref 150.0–400.0)
RBC: 4.53 Mil/uL (ref 3.87–5.11)
RDW: 14.1 % (ref 11.5–15.5)
WBC: 6.1 10*3/uL (ref 4.0–10.5)

## 2023-05-21 LAB — LIPID PANEL
Cholesterol: 177 mg/dL (ref 0–200)
HDL: 67.1 mg/dL (ref >39.00)
LDL Cholesterol: 95 mg/dL (ref 0–99)
NonHDL: 109.56
Total CHOL/HDL Ratio: 3
Triglycerides: 72 mg/dL (ref 0.0–149.0)
VLDL: 14.4 mg/dL (ref 0.0–40.0)

## 2023-05-21 LAB — TSH: TSH: 2.99 u[IU]/mL (ref 0.35–5.50)

## 2023-05-21 LAB — COMPREHENSIVE METABOLIC PANEL WITH GFR
ALT: 18 U/L (ref 0–35)
AST: 24 U/L (ref 0–37)
Albumin: 4.7 g/dL (ref 3.5–5.2)
Alkaline Phosphatase: 77 U/L (ref 39–117)
BUN: 14 mg/dL (ref 6–23)
CO2: 26 meq/L (ref 19–32)
Calcium: 9.4 mg/dL (ref 8.4–10.5)
Chloride: 105 meq/L (ref 96–112)
Creatinine, Ser: 0.95 mg/dL (ref 0.40–1.20)
GFR: 54.12 mL/min — ABNORMAL LOW (ref >60.00)
Glucose, Bld: 97 mg/dL (ref 70–99)
Potassium: 4.6 meq/L (ref 3.5–5.1)
Sodium: 142 meq/L (ref 135–145)
Total Bilirubin: 1.2 mg/dL (ref 0.2–1.2)
Total Protein: 8.3 g/dL (ref 6.0–8.3)

## 2023-05-21 LAB — VITAMIN D 25 HYDROXY (VIT D DEFICIENCY, FRACTURES): VITD: 36.24 ng/mL (ref 30.00–100.00)

## 2023-05-21 LAB — HEMOGLOBIN A1C: Hgb A1c MFr Bld: 5.5 % (ref 4.6–6.5)

## 2023-05-21 NOTE — Progress Notes (Signed)
 New Patient Office Visit  Subjective    Patient ID: Kathy Morgan, female    DOB: 15-Apr-1934  Age: 89 y.o. MRN: [id number]  CC:  Chief Complaint  Patient presents with   New Patient (Initial Visit)    HPI Kathy Morgan is a 89 y.o. female presents to establish care, follow up of chronic conditions.  Previous PCP- Theron Flavin, MD  HTN: diagnosed many years ago. Currently managed on Norvasc  5 mg and Metoprolol  succinate 50 mg once daily. She has seen cardiology in the past and had a normal echo on 05/09/22. She has potassium 20 meq due to multiple occasions of hypokalemia. She denies any blurred vision, chest pain, shortness of breath or difficulty breathing.   HLD: diagnosed many years ago. Currently managed on Lipitor 10 mg once daily. She has not had a lipid panel checked since 2023.  Immunizations: -Tetanus: unknown -Influenza: 2024 -Shingles: Completed 1st dose of Shingrix series -Pneumonia: Completed   Diet: Fair diet.  Exercise: No regular exercise.  Eye exam: Completes annually  Dental exam: Completes annually    Bone Density Scan: Completed several years ago.  Outpatient Encounter Medications as of 05/21/2023  Medication Sig   amLODipine  (NORVASC ) 5 MG tablet Take 1 tablet (5 mg total) by mouth daily.   atorvastatin  (LIPITOR) 10 MG tablet TAKE 1 TABLET DAILY   cholecalciferol (VITAMIN D) 400 UNITS TABS tablet Take 1,000 Units by mouth daily.   KLOR-CON  M20 20 MEQ tablet TAKE 1 TABLET BY MOUTH EVERY DAY   metoprolol  succinate (TOPROL -XL) 50 MG 24 hr tablet Take 1 tablet (50 mg total) by mouth daily. Take with or immediately following a meal.   No facility-administered encounter medications on file as of 05/21/2023.    Past Medical History:  Diagnosis Date   Arthritis    lower back   Hyperlipidemia    Hypertension    Vertigo    Wears dentures    full upper    Past Surgical History:  Procedure Laterality Date   BREAST SURGERY Right 01/01/2009   R cyst  removal (benign)    CATARACT EXTRACTION W/PHACO Right 01/05/2019   Procedure: CATARACT EXTRACTION PHACO AND INTRAOCULAR LENS PLACEMENT (IOC) RIGHT 2.88  00:29.7;  Surgeon: Rosa College, MD;  Location: [facility];  Service: Ophthalmology;  Laterality: Right;   CATARACT EXTRACTION W/PHACO Left 02/23/2019   Procedure: CATARACT EXTRACTION PHACO AND INTRAOCULAR LENS PLACEMENT (IOC) LEFT 4.68  00:41.3;  Surgeon: Rosa College, MD;  Location: [facility];  Service: Ophthalmology;  Laterality: Left;  needs to stay 2nd   VAGINAL HYSTERECTOMY  01/01/1989    Family History  Problem Relation Age of Onset   Cancer Mother    Dementia Father     Social History   Socioeconomic History   Marital status: Widowed    Spouse name: Not on file   Number of children: 1   Years of education: Not on file   Highest education level: Not on file  Occupational History   Occupation: Equities trader office  Tobacco Use   Smoking status: Never   Smokeless tobacco: Never  Vaping Use   Vaping status: Never Used  Substance and Sexual Activity   Alcohol use: Yes    Alcohol/week: 2.0 standard drinks of alcohol    Types: 2 Glasses of wine per week   Drug use: No   Sexual activity: Never  Other Topics Concern   Not on file  Social History Narrative   Not on  file   Social Drivers of Health   Financial Resource Strain: Low Risk  (09/02/2020)   Overall Financial Resource Strain (CARDIA)    Difficulty of Paying Living Expenses: Not hard at all  Food Insecurity: No Food Insecurity (09/02/2020)   Hunger Vital Sign    Worried About Running Out of Food in the Last Year: Never true    Ran Out of Food in the Last Year: Never true  Transportation Needs: No Transportation Needs (09/02/2020)   PRAPARE - Administrator, Civil Service (Medical): No    Lack of Transportation (Non-Medical): No  Physical Activity: Insufficiently Active (09/02/2020)   Exercise Vital Sign    Days of  Exercise per Week: 2 days    Minutes of Exercise per Session: 20 min  Stress: No Stress Concern Present (09/02/2020)   [employer] of Occupational Health - Occupational Stress Questionnaire    Feeling of Stress : Not at all  Social Connections: Moderately Integrated (09/02/2020)   Social Connection and Isolation Panel [NHANES]    Frequency of Communication with Friends and Family: More than three times a week    Frequency of Social Gatherings with Friends and Family: More than three times a week    Attends Religious Services: More than 4 times per year    Active Member of [employer] or Organizations: Yes    Attends Banker Meetings: More than 4 times per year    Marital Status: Widowed  Intimate Partner Violence: Not At Risk (09/02/2020)   Humiliation, Afraid, Rape, and Kick questionnaire    Fear of Current or Ex-Partner: No    Emotionally Abused: No    Physically Abused: No    Sexually Abused: No    Review of Systems  Constitutional:  Negative for chills, fever, malaise/fatigue and weight loss.  HENT:  Negative for congestion, ear discharge, ear pain, hearing loss, nosebleeds, sinus pain, sore throat and tinnitus.   Eyes:  Negative for blurred vision, double vision, pain, discharge and redness.  Respiratory:  Negative for cough, shortness of breath, wheezing and stridor.   Cardiovascular:  Negative for chest pain, palpitations and leg swelling.  Gastrointestinal:  Negative for abdominal pain, constipation, diarrhea, heartburn, nausea and vomiting.  Genitourinary:  Negative for dysuria, frequency and urgency.  Musculoskeletal:  Negative for myalgias.  Skin:  Negative for rash.  Neurological:  Negative for dizziness, tingling, seizures, weakness and headaches.  Endo/Heme/Allergies:  Negative for polydipsia.  Psychiatric/Behavioral:  Negative for depression, substance abuse and suicidal ideas. The patient is not nervous/anxious.         Objective    BP 122/70 (BP  Location: Left Arm, Patient Position: Sitting, Cuff Size: Normal)   Pulse 83   Temp 98 F (36.7 C) (Oral)   Ht 4' 10.9" (1.496 m)   Wt 128 lb (58.1 kg)   SpO2 96%   BMI 25.94 kg/m   Physical Exam Vitals and nursing note reviewed.  Constitutional:      Appearance: Normal appearance.  HENT:     Head: Normocephalic and atraumatic.     Right Ear: Tympanic membrane, ear canal and external ear normal.     Left Ear: Tympanic membrane, ear canal and external ear normal.     Nose: Nose normal.     Mouth/Throat:     Mouth: Mucous membranes are moist.     Pharynx: Oropharynx is clear.  Eyes:     Conjunctiva/sclera: Conjunctivae normal.     Pupils: Pupils are equal, round,  and reactive to light.  Cardiovascular:     Rate and Rhythm: Normal rate and regular rhythm.     Pulses: Normal pulses.     Heart sounds: Normal heart sounds.  Pulmonary:     Effort: Pulmonary effort is normal.     Breath sounds: Normal breath sounds.  Abdominal:     General: Abdomen is flat. Bowel sounds are normal.     Palpations: Abdomen is soft.  Musculoskeletal:        General: Normal range of motion.     Cervical back: Normal range of motion.  Skin:    General: Skin is warm and dry.     Capillary Refill: Capillary refill takes less than 2 seconds.  Neurological:     General: No focal deficit present.     Mental Status: She is alert and oriented to person, place, and time. Mental status is at baseline.     Deep Tendon Reflexes:     Reflex Scores:      Patellar reflexes are 2+ on the right side and 2+ on the left side. Psychiatric:        Mood and Affect: Mood normal.        Behavior: Behavior normal.        Thought Content: Thought content normal.        Judgment: Judgment normal.         Assessment & Plan:  Primary hypertension Assessment & Plan: BP at goal today.   Labs pending.  Continue metoprolol  succinate 50 mg once daily and amlodipine  5 mg once daily and potassium 20 meq.    Orders: -     CBC -     Comprehensive metabolic panel with GFR -     TSH  Establishing care with new doctor, encounter for Assessment & Plan: EMR reviewed briefly.    Vitamin D deficiency Assessment & Plan: Repeat vitamin d level pending.  Orders: -     TSH -     VITAMIN D 25 Hydroxy (Vit-D Deficiency, Fractures)  Pure hypercholesterolemia Assessment & Plan: Discussed at length with patient.  Reviewed lipid panel from October, 2023 with patient.   Lipid panel pending today.  Continue atorvastatin  10 mg once daily.  Orders: -     Lipid panel -     TSH  History of hyperglycemia Assessment & Plan: Chronic.  Hemoglobin A1c pending.  Orders: -     Hemoglobin A1c  Postmenopausal -     DG Bone Density; Future     Return in about 3 months (around 08/21/2023) for chronic care management.Jolanda Nation, NP

## 2023-05-21 NOTE — Assessment & Plan Note (Signed)
 BP at goal today.   Labs pending.  Continue metoprolol  succinate 50 mg once daily and amlodipine  5 mg once daily and potassium 20 meq.

## 2023-05-21 NOTE — Assessment & Plan Note (Signed)
 Repeat vitamin d level pending.

## 2023-05-21 NOTE — Patient Instructions (Addendum)
 Stop by the lab prior to leaving today. I will notify you of your results once received.   Schedule 2nd shingles vaccine at the pharmacy.   Schedule tdap  vaccine at the pharmacy.  Continue all of your medication as prescribed.   Follow up in 3 months.  You have an order for:  []   2D Mammogram  []   3D Mammogram  [x]   Bone Density     Please call for appointment:  [facility]  [address] [place] [address] [phone] [facility] [address] [place], [place] [address] [phone] [facility] [address] [place], [place] [address] [phone]   Make sure to wear two-piece clothing.  No lotions, powders, or deodorants the day of the appointment. Make sure to bring picture ID and insurance card.  Bring list of medications you are currently taking including any supplements.   Schedule your [place] screening mammogram through MyChart!   Log into your MyChart account.  Go to 'Visit' (or 'Appointments' if on mobile App) --> Schedule an Appointment  Under 'Select a Reason for Visit' choose the Mammogram Screening option.  Complete the pre-visit questions and select the time and place that best fits your schedule.

## 2023-05-21 NOTE — Assessment & Plan Note (Signed)
 EMR reviewed briefly.

## 2023-05-21 NOTE — Assessment & Plan Note (Signed)
 Chronic.  Hemoglobin A1c pending.

## 2023-05-21 NOTE — Assessment & Plan Note (Signed)
 Discussed at length with patient.  Reviewed lipid panel from October, 2023 with patient.   Lipid panel pending today.  Continue atorvastatin  10 mg once daily.

## 2023-05-22 ENCOUNTER — Ambulatory Visit: Payer: Self-pay | Admitting: General Practice

## 2023-05-23 ENCOUNTER — Telehealth: Payer: Self-pay | Admitting: General Practice

## 2023-05-23 NOTE — Telephone Encounter (Signed)
 Copied from CRM [id number]. Topic: Clinical - Lab/Test Results >> May 23, 2023  8:51 AM Kita Perish H wrote: Reason for CRM: Patient called to obtain lab results,  gave patient message from provider as stated, patient acknowledged

## 2023-05-23 NOTE — Telephone Encounter (Signed)
 Patient will need to call and schedule this herself as they do not usually call patients to set up dexa scans. She can call [facility] in [place] at [phone]. I tried to call patient to rely information but no answer; LMTCB.

## 2023-05-23 NOTE — Telephone Encounter (Signed)
 Copied from CRM [id number]. Topic: Referral - Question >> May 23, 2023  8:48 AM Kita Perish H wrote: Reason for CRM: Patient is calling stating at last visit [place] mentioned that patient was to have a bone density test done and she hasn't heard anything and not sure what the next steps or she's not sure if she was given the referral for the test, please reach out, thanks.  Chrishonda [phone]

## 2023-05-23 NOTE — Telephone Encounter (Signed)
Result note updated.

## 2023-05-24 NOTE — Telephone Encounter (Signed)
 Received CRM stating pt called office returning jessica's missed call. Pt is requesting a call back. Call back # [phone]

## 2023-05-24 NOTE — Telephone Encounter (Signed)
 Spoke with patient and information given to her to schedule dexa scan.

## 2023-06-11 ENCOUNTER — Ambulatory Visit (INDEPENDENT_AMBULATORY_CARE_PROVIDER_SITE_OTHER): Admitting: Podiatry

## 2023-06-11 DIAGNOSIS — M7752 Other enthesopathy of left foot: Secondary | ICD-10-CM | POA: Diagnosis not present

## 2023-06-11 DIAGNOSIS — M778 Other enthesopathies, not elsewhere classified: Secondary | ICD-10-CM

## 2023-06-11 NOTE — Progress Notes (Signed)
  Subjective:  Patient ID: Kathy Morgan, female    DOB: [birth year],  MRN: [id number]  Chief Complaint  Patient presents with   Injections    89 y.o. female presents with the above complaint.  Patient was also followed on dorsal midfoot pain/arthritic pain issue she states that injection always helps with the pain on the F she can tell she is here for another injection denies any other acute complaints  Review of Systems: Negative except as noted in the HPI. Denies N/V/F/Ch.  Past Medical History:  Diagnosis Date   Arthritis    lower back   Hyperlipidemia    Hypertension    Vertigo    Wears dentures    full upper    Current Outpatient Medications:    amLODipine  (NORVASC ) 5 MG tablet, Take 1 tablet (5 mg total) by mouth daily., Disp: 90 tablet, Rfl: 3   atorvastatin  (LIPITOR) 10 MG tablet, TAKE 1 TABLET DAILY, Disp: 90 tablet, Rfl: 3   cholecalciferol (VITAMIN D ) 400 UNITS TABS tablet, Take 1,000 Units by mouth daily., Disp: , Rfl:    KLOR-CON  M20 20 MEQ tablet, TAKE 1 TABLET BY MOUTH EVERY DAY, Disp: 90 tablet, Rfl: 1   metoprolol  succinate (TOPROL -XL) 50 MG 24 hr tablet, Take 1 tablet (50 mg total) by mouth daily. Take with or immediately following a meal., Disp: 90 tablet, Rfl: 3  Social History   Tobacco Use  Smoking Status Never  Smokeless Tobacco Never    No Known Allergies Objective:  There were no vitals filed for this visit. There is no height or weight on file to calculate BMI. Constitutional Well developed. Well nourished.  Vascular Dorsalis pedis pulses palpable bilaterally. Posterior tibial pulses palpable bilaterally. Capillary refill normal to all digits.  No cyanosis or clubbing noted. Pedal hair growth normal.  Neurologic Normal speech. Oriented to person, place, and time. Epicritic sensation to light touch grossly present bilaterally.  Dermatologic Nails well groomed and normal in appearance. No open wounds. No skin lesions.  Orthopedic: Pain on  palpation to the left dorsal midfoot.  Pain at the third fourth and fifth tarsometatarsal joint.  Pain with range of motion of the joints.  No pain at the metatarsophalangeal joints.  No extensor or flexor tendinitis noted.   Radiographs: 3 views of skeletally mature adult left foot: Osteoarthritic changes noted of the left first metatarsophalangeal joint severe in nature.  Severe osteoarthritic changes noted of the midfoot joints as well.  Flatfoot foot structure noted.  No other bony abnormalities identified Assessment:   No diagnosis found.        Plan:  Patient was evaluated and treated and all questions answered.  Left dorsal midfoot arthritis -I explained the patient the etiology of arthritis and various treatment options were discussed.  Given the amount of pain she is having I believe she will benefit from a steroid injection to help decrease acute inflammatory component associated pain.  Patient agrees with plan like to proceed with a steroid injection. -Another steroid injection was performed at left dorsal midfoot at point of maximal tenderness using 1% plain Lidocaine  and 10 mg of Kenalog . This was well tolerated. -Continue using Voltaren gel over-the-counter.  Right second mild toe contusion  -Clinically healed  No follow-ups on file.

## 2023-06-23 ENCOUNTER — Emergency Department

## 2023-06-23 ENCOUNTER — Other Ambulatory Visit: Payer: Self-pay

## 2023-06-23 ENCOUNTER — Emergency Department
Admission: EM | Admit: 2023-06-23 | Discharge: 2023-06-23 | Disposition: A | Discharge status: Home / Self Care | Attending: Emergency Medicine | Admitting: Emergency Medicine

## 2023-06-23 DIAGNOSIS — R42 Dizziness and giddiness: Secondary | ICD-10-CM | POA: Diagnosis present

## 2023-06-23 DIAGNOSIS — G319 Degenerative disease of nervous system, unspecified: Secondary | ICD-10-CM | POA: Diagnosis not present

## 2023-06-23 DIAGNOSIS — I6782 Cerebral ischemia: Secondary | ICD-10-CM | POA: Diagnosis not present

## 2023-06-23 DIAGNOSIS — Z0000 Encounter for general adult medical examination without abnormal findings: Secondary | ICD-10-CM | POA: Insufficient documentation

## 2023-06-23 DIAGNOSIS — I10 Essential (primary) hypertension: Secondary | ICD-10-CM | POA: Diagnosis not present

## 2023-06-23 LAB — CBC
HCT: 40 % (ref 36.0–46.0)
Hemoglobin: 13.4 g/dL (ref 12.0–15.0)
MCH: 31.4 pg (ref 26.0–34.0)
MCHC: 33.5 g/dL (ref 30.0–36.0)
MCV: 93.7 fL (ref 80.0–100.0)
Platelets: 158 10*3/uL (ref 150–400)
RBC: 4.27 MIL/uL (ref 3.87–5.11)
RDW: 14.3 % (ref 11.5–15.5)
WBC: 8.4 10*3/uL (ref 4.0–10.5)
nRBC: 0 % (ref 0.0–0.2)

## 2023-06-23 LAB — URINALYSIS, ROUTINE W REFLEX MICROSCOPIC
Bilirubin Urine: NEGATIVE
Glucose, UA: NEGATIVE mg/dL
Hgb urine dipstick: NEGATIVE
Ketones, ur: NEGATIVE mg/dL
Leukocytes,Ua: NEGATIVE
Nitrite: NEGATIVE
Protein, ur: NEGATIVE mg/dL
Specific Gravity, Urine: 1.006 (ref 1.005–1.030)
pH: 5 (ref 5.0–8.0)

## 2023-06-23 LAB — BASIC METABOLIC PANEL WITH GFR
Anion gap: 8 (ref 5–15)
BUN: 19 mg/dL (ref 8–23)
CO2: 26 mmol/L (ref 22–32)
Calcium: 9 mg/dL (ref 8.9–10.3)
Chloride: 107 mmol/L (ref 98–111)
Creatinine, Ser: 0.82 mg/dL (ref 0.44–1.00)
GFR, Estimated: >60 mL/min (ref >60)
Glucose, Bld: 96 mg/dL (ref 70–99)
Potassium: 4 mmol/L (ref 3.5–5.1)
Sodium: 141 mmol/L (ref 135–145)

## 2023-06-23 NOTE — ED Triage Notes (Addendum)
 Pt comes with c/o just feeling not right. Pt states no dizziness, cp or sob. Pt states she does take bp meds and did take it today. Pt states it might be from her bp being high.  Pt states she was in the store when this happened. Pt states she did eat today but it wasn't a lot.   Pt speaking in clear complete sentences. Pt A*OX4. Pt denies ay blurry vision or weakness.

## 2023-06-23 NOTE — Discharge Instructions (Signed)
 As we discussed please continue to take your blood pressure medication as prescribed by your doctor.  Please follow-up with your doctor this week for recheck/reevaluation.  Return to the emergency department for any symptom personally concerning to yourself.

## 2023-06-23 NOTE — ED Provider Notes (Signed)
 [facility] Provider Note    Event Date/Time   First MD Initiated Contact with Patient 06/23/23 1521     (approximate)  History   Chief Complaint: Dizziness  HPI  Kathy Morgan is a 89 y.o. female with a past medical history of arthritis, hypertension, hyperlipidemia, presents to the emergency department for medical evaluation.  According to the patient yesterday and then again today she states she is felt off.  Patient cannot exactly describe what the feeling is, states she just did not feel right.  Patient denies any dizziness denies any headache denies any weakness or numbness denies any tingling denies any chest pain abdominal pain.  Patient states she was just worried so she came to the emergency department for evaluation.  Patient denies any symptoms currently.  Blood pressure is elevated on arrival 178/65.  Patient takes blood pressure medication which she did take today.  Physical Exam   Triage Vital Signs: ED Triage Vitals  Encounter Vitals Group     BP 06/23/23 1405 (!) 178/65     Girls Systolic BP Percentile --      Girls Diastolic BP Percentile --      Boys Systolic BP Percentile --      Boys Diastolic BP Percentile --      Pulse Rate 06/23/23 1405 93     Resp 06/23/23 1405 18     Temp 06/23/23 1405 98.1 F (36.7 C)     Temp src --      SpO2 06/23/23 1405 98 %     Weight 06/23/23 1402 121 lb (54.9 kg)     Height 06/23/23 1402 5' (1.524 m)     Head Circumference --      Peak Flow --      Pain Score 06/23/23 1402 0     Pain Loc --      Pain Education --      Exclude from Growth Chart --     Most recent vital signs: Vitals:   06/23/23 1405  BP: (!) 178/65  Pulse: 93  Resp: 18  Temp: 98.1 F (36.7 C)  SpO2: 98%    General: Awake, no distress.  Very well-appearing, appears younger than stated age. CV:  Good peripheral perfusion.  Regular rate and rhythm  Resp:  Normal effort.  Equal breath sounds bilaterally.  Abd:  No  distention.  Soft, nontender.  No rebound or guarding. Other:  Equal grip strength bilaterally.  No cranial nerve deficits.  Good strength on exam.   ED Results / Procedures / Treatments   RADIOLOGY  I have reviewed and interpreted CT images.  No obvious bleed seen on my evaluation. Radiology is read the CT scan is negative for acute abnormality.   MEDICATIONS ORDERED IN ED: Medications - No data to display   IMPRESSION / MDM / ASSESSMENT AND PLAN / ED COURSE  I reviewed the triage vital signs and the nursing notes.  Patient's presentation is most consistent with acute presentation with potential threat to life or bodily function.  Patient presents to the emergency department for feeling not right.  Patient has a difficult time describing exactly what is not feeling right.  However has essentially denied all my review of systems symptom questions.  Patient appears very well reassuring physical exam reassuring neurological exam.  Patient's lab work shows reassuring CBC normal chemistry normal urinalysis normal CT scan of the head.  Patient does have mild hypertension, discussed with patient to continue to check  her blood pressure at times at home or at a pharmacy and to follow-up with her doctor within the next couple days.  Patient reassured by today's workup.  Will discharge patient home with outpatient follow-up.  FINAL CLINICAL IMPRESSION(S) / ED DIAGNOSES   Medical evaluation    Note:  This document was prepared using Dragon voice recognition software and may include unintentional dictation errors.   Dorothyann Drivers, MD 06/23/23 1534

## 2023-07-08 ENCOUNTER — Ambulatory Visit
Admission: RE | Admit: 2023-07-08 | Discharge: 2023-07-08 | Disposition: A | Discharge status: Home / Self Care | Source: Ambulatory Visit | Attending: General Practice | Admitting: General Practice

## 2023-07-08 DIAGNOSIS — Z780 Asymptomatic menopausal state: Secondary | ICD-10-CM | POA: Insufficient documentation

## 2023-07-31 ENCOUNTER — Ambulatory Visit (INDEPENDENT_AMBULATORY_CARE_PROVIDER_SITE_OTHER)

## 2023-07-31 ENCOUNTER — Other Ambulatory Visit: Payer: Self-pay | Admitting: Primary Care

## 2023-07-31 VITALS — BP 118/68 | Ht 58.9 in | Wt 126.8 lb

## 2023-07-31 DIAGNOSIS — Z0000 Encounter for general adult medical examination without abnormal findings: Secondary | ICD-10-CM | POA: Diagnosis not present

## 2023-07-31 DIAGNOSIS — I10 Essential (primary) hypertension: Secondary | ICD-10-CM | POA: Diagnosis not present

## 2023-07-31 MED ORDER — METOPROLOL SUCCINATE ER 50 MG PO TB24: 50.0000 mg | ORAL_TABLET | Freq: Every day | ORAL | 0 refills | Status: DC

## 2023-07-31 NOTE — Progress Notes (Signed)
 Please attest and cosign this visit due to patients primary care provider not being in the office at the time the visit was completed.    Subjective:   Kathy Morgan is a 89 y.o. who presents for a Medicare Wellness preventive visit.  As a reminder, Annual Wellness Visits don't include a physical exam, and some assessments may be limited, especially if this visit is performed virtually. We may recommend an in-person follow-up visit with your provider if needed.  Visit Complete: In person  Persons Participating in Visit: Patient.  AWV Questionnaire: No: Patient Medicare AWV questionnaire was not completed prior to this visit.  Cardiac Risk Factors include: advanced age (>41men, >37 women);dyslipidemia;hypertension     Objective:    Today's Vitals   07/31/23 1005  BP: 118/68  Weight: 126 lb 12.8 oz (57.5 kg)  Height: 4' 10.9 (1.496 m)   Body mass index is 25.7 kg/m.     07/31/2023   10:30 AM 06/23/2023    2:02 PM 01/03/2023   11:23 AM 03/25/2022   11:14 AM 02/16/2022   11:55 AM 03/20/2021    2:54 PM 10/04/2020   12:12 PM  Advanced Directives  Does Patient Have a Medical Advance Directive? No No No Yes No Yes No  Type of Advance Directive    Healthcare Power of Attorney  Living will   Would patient like information on creating a medical advance directive?     No - Patient declined      Current Medications (verified) Outpatient Encounter Medications as of 07/31/2023  Medication Sig   amLODipine  (NORVASC ) 5 MG tablet Take 1 tablet (5 mg total) by mouth daily.   atorvastatin  (LIPITOR) 10 MG tablet TAKE 1 TABLET DAILY   cholecalciferol (VITAMIN D ) 400 UNITS TABS tablet Take 1,000 Units by mouth daily.   KLOR-CON  M20 20 MEQ tablet TAKE 1 TABLET BY MOUTH EVERY DAY   metoprolol  succinate (TOPROL -XL) 50 MG 24 hr tablet Take 1 tablet (50 mg total) by mouth daily. Take with or immediately following a meal.   No facility-administered encounter medications on file as of 07/31/2023.     Allergies (verified) Patient has no known allergies.   History: Past Medical History:  Diagnosis Date   Arthritis    lower back   Hyperlipidemia    Hypertension    Vertigo    Wears dentures    full upper   Past Surgical History:  Procedure Laterality Date   BREAST SURGERY Right 01/01/2009   R cyst removal (benign)    CATARACT EXTRACTION W/PHACO Right 01/05/2019   Procedure: CATARACT EXTRACTION PHACO AND INTRAOCULAR LENS PLACEMENT (IOC) RIGHT 2.88  00:29.7;  Surgeon: Myrna Adine Anes, MD;  Location: [facility];  Service: Ophthalmology;  Laterality: Right;   CATARACT EXTRACTION W/PHACO Left 02/23/2019   Procedure: CATARACT EXTRACTION PHACO AND INTRAOCULAR LENS PLACEMENT (IOC) LEFT 4.68  00:41.3;  Surgeon: Myrna Adine Anes, MD;  Location: [facility];  Service: Ophthalmology;  Laterality: Left;  needs to stay 2nd   VAGINAL HYSTERECTOMY  01/01/1989   Family History  Problem Relation Age of Onset   Cancer Mother    Dementia Father    Social History   Socioeconomic History   Marital status: Widowed    Spouse name: Not on file   Number of children: 1   Years of education: Not on file   Highest education level: Not on file  Occupational History   Occupation: Equities trader office  Tobacco Use   Smoking status:  Never   Smokeless tobacco: Never  Vaping Use   Vaping status: Never Used  Substance and Sexual Activity   Alcohol use: Yes    Alcohol/week: 2.0 standard drinks of alcohol    Types: 2 Glasses of wine per week   Drug use: No   Sexual activity: Never  Other Topics Concern   Not on file  Social History Narrative   Not on file   Social Drivers of Health   Financial Resource Strain: Low Risk  (07/31/2023)   Overall Financial Resource Strain (CARDIA)    Difficulty of Paying Living Expenses: Not hard at all  Food Insecurity: No Food Insecurity (07/31/2023)   Hunger Vital Sign    Worried About Running Out of Food in the Last Year: Never  true    Ran Out of Food in the Last Year: Never true  Transportation Needs: No Transportation Needs (07/31/2023)   PRAPARE - Administrator, Civil Service (Medical): No    Lack of Transportation (Non-Medical): No  Physical Activity: Insufficiently Active (07/31/2023)   Exercise Vital Sign    Days of Exercise per Week: 3 days    Minutes of Exercise per Session: 30 min  Stress: No Stress Concern Present (09/02/2020)   [employer] of Occupational Health - Occupational Stress Questionnaire    Feeling of Stress : Not at all  Social Connections: Moderately Integrated (07/31/2023)   Social Connection and Isolation Panel    Frequency of Communication with Friends and Family: More than three times a week    Frequency of Social Gatherings with Friends and Family: More than three times a week    Attends Religious Services: More than 4 times per year    Active Member of [employer] or Organizations: Yes    Attends Banker Meetings: More than 4 times per year    Marital Status: Widowed    Tobacco Counseling Counseling given: Not Answered  Clinical Intake:  Pre-visit preparation completed: Yes  Pain : No/denies pain    BMI - recorded: 25.7 Nutritional Status: BMI 25 -29 Overweight Nutritional Risks: None Diabetes: No  Lab Results  Component Value Date   HGBA1C 5.5 05/21/2023     How often do you need to have someone help you when you read instructions, pamphlets, or other written materials from your doctor or pharmacy?: 1 - Never  Interpreter Needed?: No  Comments: lives alone Information entered by :: B.Davidson Palmieri,LPN   Activities of Daily Living     07/31/2023   10:32 AM  In your present state of health, do you have any difficulty performing the following activities:  Hearing? 0  Vision? 0  Difficulty concentrating or making decisions? 0  Walking or climbing stairs? 0  Dressing or bathing? 0  Doing errands, shopping? 0  Preparing Food and eating ? N   Using the Toilet? N  In the past six months, have you accidently leaked urine? Y  Do you have problems with loss of bowel control? N  Managing your Medications? N  Managing your Finances? N  Housekeeping or managing your Housekeeping? N    Patient Care Team: Vincente Shivers, NP as PCP - General (General Practice)  I have updated your Care Teams any recent Medical Services you may have received from other providers in the past year.     Assessment:   This is a routine wellness examination for Kathy Morgan.  Hearing/Vision screen Hearing Screening - Comments:: Pt says her hearing Vision Screening - Comments:: Pt says  her vision is good;readers only   Goals Addressed             This Visit's Progress    Patient Stated       I want to stay healthy, in my right and active       Depression Screen     07/31/2023   10:19 AM 05/21/2023   12:37 PM 07/19/2021   11:18 AM 09/02/2020   12:27 PM 09/02/2020   12:23 PM 07/26/2020   12:05 PM 12/14/2019   11:11 AM  PHQ 2/9 Scores  PHQ - 2 Score 0 0 0 0 0 0 0  PHQ- 9 Score  1     0    Fall Risk     07/31/2023   10:14 AM 05/21/2023   12:37 PM 07/19/2021   11:18 AM 09/02/2020   12:27 PM 07/12/2020   12:42 PM  Fall Risk   Falls in the past year? 0 0 0 0 0  Number falls in past yr: 0 0 0 0 0  Injury with Fall? 0 0 0 0 0  Risk for fall due to : No Fall Risks No Fall Risks No Fall Risks No Fall Risks No Fall Risks  Follow up Education provided;Falls prevention discussed Falls evaluation completed Falls evaluation completed  Falls evaluation completed  Falls evaluation completed      Data saved with a previous flowsheet row definition    MEDICARE RISK AT HOME:  Medicare Risk at Home Any stairs in or around the home?: Yes If so, are there any without handrails?: Yes Home free of loose throw rugs in walkways, pet beds, electrical cords, etc?: Yes Adequate lighting in your home to reduce risk of falls?: Yes Life alert?: No Use of a cane, walker  or w/c?: No Grab bars in the bathroom?: No Shower chair or bench in shower?: No Elevated toilet seat or a handicapped toilet?: No  TIMED UP AND GO:  Was the test performed?  Yes  Length of time to ambulate 10 feet: 10 sec Gait steady and fast without use of assistive device  Cognitive Function: 6CIT completed    09/02/2020   12:29 PM  MMSE - Mini Mental State Exam  Orientation to time 5  Orientation to Place 5  Registration 3  Attention/ Calculation 5  Recall 3  Language- name 2 objects 2  Language- repeat 1  Language- follow 3 step command 3  Language- read & follow direction 1  Write a sentence 1  Copy design 1  Total score 30        07/31/2023   10:36 AM 09/02/2020   12:29 PM  6CIT Screen  What Year? 0 points 0 points  What month? 0 points 0 points  What time? 0 points 0 points  Count back from 20 0 points 0 points  Months in reverse 0 points 0 points  Repeat phrase 8 points 0 points  Total Score 8 points 0 points    Immunizations Immunization History  Administered Date(s) Administered   Fluad Quad(high Dose 65+) 11/29/2021   Influenza, High Dose Seasonal PF 11/06/2022   Influenza-Unspecified 11/02/2013, 09/10/2014   PFIZER(Purple Top)SARS-COV-2 Vaccination 03/01/2019, 03/23/2019, 12/18/2019   PNEUMOCOCCAL CONJUGATE-20 10/31/2021   Pneumococcal Polysaccharide-23 09/10/2014   Zoster Recombinant(Shingrix) 06/04/2013    Screening Tests Health Maintenance  Topic Date Due   DTaP/Tdap/Td (1 - Tdap) Never done   Zoster Vaccines- Shingrix (2 of 2) 07/30/2013   COVID-19 Vaccine (4 - 2024-25 season) 06/05/2026 (  Originally 09/02/2022)   INFLUENZA VACCINE  08/02/2023   Medicare Annual Wellness (AWV)  07/30/2024   Pneumococcal Vaccine: 50+ Years  Completed   DEXA SCAN  Completed   Hepatitis B Vaccines  Aged Out   HPV VACCINES  Aged Out   Meningococcal B Vaccine  Aged Out    Health Maintenance  Health Maintenance Due  Topic Date Due   DTaP/Tdap/Td (1 - Tdap)  Never done   Zoster Vaccines- Shingrix (2 of 2) 07/30/2013   Health Maintenance Items Addressed: None needed at this time  Additional Screening:  Vision Screening: Recommended annual ophthalmology exams for early detection of glaucoma and other disorders of the eye. Would you like a referral to an eye doctor? No    Dental Screening: Recommended annual dental exams for proper oral hygiene  Community Resource Referral / Chronic Care Management: CRR required this visit?  No   CCM required this visit?  No   Plan:    I have personally reviewed and noted the following in the patient's chart:   Medical and social history Use of alcohol, tobacco or illicit drugs  Current medications and supplements including opioid prescriptions. Patient is not currently taking opioid prescriptions. Functional ability and status Nutritional status Physical activity Advanced directives List of other physicians Hospitalizations, surgeries, and ER visits in previous 12 months Vitals Screenings to include cognitive, depression, and falls Referrals and appointments  In addition, I have reviewed and discussed with patient certain preventive protocols, quality metrics, and best practice recommendations. A written personalized care plan for preventive services as well as general preventive health recommendations were provided to patient.   Erminio LITTIE Saris, LPN   [id number]   After Visit Summary: AVS printed and given to patient  Notes: Please refer to Routing Comments.

## 2023-07-31 NOTE — Patient Instructions (Signed)
 Kathy Morgan , Thank you for taking time out of your busy schedule to complete your Annual Wellness Visit with me. I enjoyed our conversation and look forward to speaking with you again next year. I, as well as your care team,  appreciate your ongoing commitment to your health goals. Please review the following plan we discussed and let me know if I can assist you in the future. Your Game plan/ To Do List    Follow up Visits: Next Medicare AWV with our clinical staff: 07/31/24 @ 10:10am   Have you seen your provider in the last 6 months (3 months if uncontrolled diabetes)? Yes Next Office Visit with your provider: 08/21/23  Clinician Recommendations:  Aim for 30 minutes of exercise or brisk walking, 6-8 glasses of water, and 5 servings of fruits and vegetables each day.       This is a list of the screening recommended for you and due dates:  Health Maintenance  Topic Date Due   DTaP/Tdap/Td vaccine (1 - Tdap) Never done   Zoster (Shingles) Vaccine (2 of 2) 07/30/2013   COVID-19 Vaccine (4 - 2024-25 season) 06/05/2026*   Flu Shot  08/02/2023   Medicare Annual Wellness Visit  07/30/2024   Pneumococcal Vaccine for age over 62  Completed   DEXA scan (bone density measurement)  Completed   Hepatitis B Vaccine  Aged Out   HPV Vaccine  Aged Out   Meningitis B Vaccine  Aged Out  *Topic was postponed. The date shown is not the original due date.    Advanced directives: (Declined) Advance directive discussed with you today. Even though you declined this today, please call our office should you change your mind, and we can give you the proper paperwork for you to fill out. Advance Care Planning is important because it:  [x]  Makes sure you receive the medical care that is consistent with your values, goals, and preferences  [x]  It provides guidance to your family and loved ones and reduces their decisional burden about whether or not they are making the right decisions based on your  wishes.  Follow the link provided in your after visit summary or read over the paperwork we have mailed to you to help you started getting your Advance Directives in place. If you need assistance in completing these, please reach out to us  so that we can help you!

## 2023-08-21 ENCOUNTER — Encounter: Payer: Self-pay | Admitting: General Practice

## 2023-08-21 ENCOUNTER — Ambulatory Visit (INDEPENDENT_AMBULATORY_CARE_PROVIDER_SITE_OTHER): Admitting: General Practice

## 2023-08-21 VITALS — BP 120/64 | HR 78 | Temp 97.9°F | Ht 58.9 in | Wt 126.0 lb

## 2023-08-21 DIAGNOSIS — E7800 Pure hypercholesterolemia, unspecified: Secondary | ICD-10-CM | POA: Diagnosis not present

## 2023-08-21 DIAGNOSIS — E876 Hypokalemia: Secondary | ICD-10-CM

## 2023-08-21 DIAGNOSIS — I10 Essential (primary) hypertension: Secondary | ICD-10-CM | POA: Diagnosis not present

## 2023-08-21 DIAGNOSIS — E559 Vitamin D deficiency, unspecified: Secondary | ICD-10-CM

## 2023-08-21 DIAGNOSIS — M810 Age-related osteoporosis without current pathological fracture: Secondary | ICD-10-CM

## 2023-08-21 LAB — COMPREHENSIVE METABOLIC PANEL WITH GFR
ALT: 17 U/L (ref 0–35)
AST: 22 U/L (ref 0–37)
Albumin: 4.3 g/dL (ref 3.5–5.2)
Alkaline Phosphatase: 62 U/L (ref 39–117)
BUN: 18 mg/dL (ref 6–23)
CO2: 30 meq/L (ref 19–32)
Calcium: 9.3 mg/dL (ref 8.4–10.5)
Chloride: 103 meq/L (ref 96–112)
Creatinine, Ser: 0.92 mg/dL (ref 0.40–1.20)
GFR: 56.15 mL/min — ABNORMAL LOW (ref >60.00)
Glucose, Bld: 91 mg/dL (ref 70–99)
Potassium: 4.3 meq/L (ref 3.5–5.1)
Sodium: 140 meq/L (ref 135–145)
Total Bilirubin: 0.9 mg/dL (ref 0.2–1.2)
Total Protein: 7.9 g/dL (ref 6.0–8.3)

## 2023-08-21 LAB — VITAMIN D 25 HYDROXY (VIT D DEFICIENCY, FRACTURES): VITD: 39.42 ng/mL (ref 30.00–100.00)

## 2023-08-21 MED ORDER — METOPROLOL SUCCINATE ER 50 MG PO TB24: 50.0000 mg | ORAL_TABLET | Freq: Every day | ORAL | 1 refills | Status: AC

## 2023-08-21 MED ORDER — AMLODIPINE BESYLATE 5 MG PO TABS: 5.0000 mg | ORAL_TABLET | Freq: Every day | ORAL | 1 refills | Status: AC

## 2023-08-21 MED ORDER — ATORVASTATIN CALCIUM 10 MG PO TABS: 10.0000 mg | ORAL_TABLET | Freq: Every day | ORAL | 1 refills | Status: AC

## 2023-08-21 MED ORDER — POTASSIUM CHLORIDE CRYS ER 20 MEQ PO TBCR
20.0000 meq | EXTENDED_RELEASE_TABLET | Freq: Every day | ORAL | 1 refills | Status: AC
Start: 2023-08-21 — End: ?

## 2023-08-21 NOTE — Progress Notes (Signed)
 Established Patient Office Visit  Subjective   Patient ID: Kathy Morgan, female    DOB: 29-Aug-1934  Age: 89 y.o. MRN: [id number]  Chief Complaint  Patient presents with   Hypertension    Patient here today for BP f/u. Patient is taking amlodipine  and metoprolol  and BP is doing okay per patient.    Kathy Morgan is a 89 year old female with past medical history of HTN, HLD, vertigo presents today for a follow up.   Discussed the use of AI scribe software for clinical note transcription with the patient, who gave verbal consent to proceed.  History of Present Illness She is here for a routine follow-up to monitor her blood pressure, cholesterol levels, kidney function, and overall health. She has a history of hypertension and takes amlodipine  5 mg once daily. She also takes potassium chloride  20 mEq daily.  For hyperlipidemia, she takes Lipitor 10 mg once daily in the evening.  She was diagnosed with osteoporosis following a bone density test conducted on July 08, 2023. She takes calcium  and vitamin D  supplements, although her vitamin D  levels were on the lower end of normal during the last check. She is concerned about the implications of osteoporosis, especially since she lives alone.   Recently, she visited the emergency room due to feeling 'off,' but all tests were normal. No fever, chills, chest pain, leg swelling, stomach problems, or urinary issues. She attributes frequent urination to her blood pressure medication.  Her social history includes living alone. She is active and aims to maintain her independence.    Patient Active Problem List   Diagnosis Date Noted   Age-related osteoporosis without current pathological fracture 08/21/2023   Vitamin D  deficiency 05/21/2023   History of hyperglycemia 05/21/2023   Establishing care with new doctor, encounter for 05/21/2023   Hallux valgus of right foot 07/12/2020   Post-COVID chronic cough 06/24/2020   Neck muscle spasm  02/26/2020   Sprain of right rotator cuff capsule 09/09/2019   Vertigo 09/09/2019   Hypertension    Hyperlipidemia    Arthritis 04/08/2013   Past Medical History:  Diagnosis Date   Arthritis    lower back   Hyperlipidemia    Hypertension    Vertigo    Wears dentures    full upper   Past Surgical History:  Procedure Laterality Date   BREAST SURGERY Right 01/01/2009   R cyst removal (benign)    CATARACT EXTRACTION W/PHACO Right 01/05/2019   Procedure: CATARACT EXTRACTION PHACO AND INTRAOCULAR LENS PLACEMENT (IOC) RIGHT 2.88  00:29.7;  Surgeon: Myrna Adine Anes, MD;  Location: [facility];  Service: Ophthalmology;  Laterality: Right;   CATARACT EXTRACTION W/PHACO Left 02/23/2019   Procedure: CATARACT EXTRACTION PHACO AND INTRAOCULAR LENS PLACEMENT (IOC) LEFT 4.68  00:41.3;  Surgeon: Myrna Adine Anes, MD;  Location: [facility];  Service: Ophthalmology;  Laterality: Left;  needs to stay 2nd   VAGINAL HYSTERECTOMY  01/01/1989   No Known Allergies       08/21/2023   11:39 AM 07/31/2023   10:19 AM 05/21/2023   12:37 PM  Depression screen PHQ 2/9  Decreased Interest 0 0 0  Down, Depressed, Hopeless 0 0 0  PHQ - 2 Score 0 0 0  Altered sleeping 1  1  Tired, decreased energy 0  0  Change in appetite 0  0  Feeling bad or failure about yourself  0  0  Trouble concentrating 0  0  Moving slowly or fidgety/restless 0  0  Suicidal thoughts 0  0  PHQ-9 Score 1  1  Difficult doing work/chores Not difficult at all  Not difficult at all       08/21/2023   11:39 AM 05/21/2023   12:37 PM  GAD 7 : Generalized Anxiety Score  Nervous, Anxious, on Edge 0 0  Control/stop worrying 1 0  Worry too much - different things 0 0  Trouble relaxing 0 0  Restless 0 0  Easily annoyed or irritable 0 0  Afraid - awful might happen 1 0  Total GAD 7 Score 2 0  Anxiety Difficulty Not difficult at all Not difficult at all      Review of Systems  Constitutional:  Negative for  chills and fever.  Respiratory:  Negative for shortness of breath.   Cardiovascular:  Negative for chest pain and leg swelling.  Gastrointestinal:  Negative for abdominal pain, constipation, diarrhea, heartburn, nausea and vomiting.  Genitourinary:  Negative for dysuria, frequency and urgency.  Neurological:  Negative for dizziness and headaches.  Endo/Heme/Allergies:  Negative for polydipsia.  Psychiatric/Behavioral:  Negative for depression and suicidal ideas. The patient is not nervous/anxious.       Objective:     BP 120/64   Pulse 78   Temp 97.9 F (36.6 C) (Temporal)   Ht 4' 10.9 (1.496 m)   Wt 126 lb (57.2 kg)   SpO2 98%   BMI 25.54 kg/m  BP Readings from Last 3 Encounters:  08/21/23 120/64  07/31/23 118/68  06/23/23 (!) 178/65   Wt Readings from Last 3 Encounters:  08/21/23 126 lb (57.2 kg)  07/31/23 126 lb 12.8 oz (57.5 kg)  06/23/23 121 lb (54.9 kg)      Physical Exam Vitals and nursing note reviewed.  Constitutional:      Appearance: Normal appearance.  Cardiovascular:     Rate and Rhythm: Normal rate and regular rhythm.     Pulses: Normal pulses.     Heart sounds: Normal heart sounds.  Pulmonary:     Effort: Pulmonary effort is normal.     Breath sounds: Normal breath sounds.  Neurological:     Mental Status: She is alert and oriented to person, place, and time.  Psychiatric:        Mood and Affect: Mood normal.        Behavior: Behavior normal.        Thought Content: Thought content normal.        Judgment: Judgment normal.      No results found for any visits on 08/21/23.     The ASCVD Risk score (Arnett DK, et al., 2019) failed to calculate for the following reasons:   The 2019 ASCVD risk score is only valid for ages 65 to 95    Assessment & Plan:  Primary hypertension -     Comprehensive metabolic panel with GFR -     Metoprolol  Succinate ER; Take 1 tablet (50 mg total) by mouth daily. Take with or immediately following a meal.   Dispense: 90 tablet; Refill: 1 -     amLODIPine  Besylate; Take 1 tablet (5 mg total) by mouth daily.  Dispense: 90 tablet; Refill: 1  Pure hypercholesterolemia -     Atorvastatin  Calcium ; Take 1 tablet (10 mg total) by mouth daily.  Dispense: 90 tablet; Refill: 1  Hypokalemia -     Potassium Chloride  Crys ER; Take 1 tablet (20 mEq total) by mouth daily.  Dispense: 90 tablet; Refill: 1  Vitamin  D deficiency -     VITAMIN D  25 Hydroxy (Vit-D Deficiency, Fractures)  Age-related osteoporosis without current pathological fracture    Assessment and Plan Assessment & Plan Osteoporosis Osteoporosis confirmed with increased fracture risk. Discussed treatment options and emphasized fall prevention. - Provide reading material on alendronate and Prolia. - Continue calcium  1200 mg daily and vitamin D  800 units daily. - Encourage weight-bearing exercise such as walking. - Order bone density scan every two years. - Avoid falls by removing throw rugs and wearing good grip shoes. - she will update after she has decided which medication she would like to start.  Primary hypertension Blood pressure well-controlled on current medication. - Continue Amlodipine  5 mg oral daily. - Continue Metoprolol  succinate 50 mg oral daily. - Continue Potassium 20 MEQ once daily. - Monitor blood pressure regularly.  Pure hypercholesterolemia Cholesterol levels previously acceptable. - Continue Atorvastatin  10 mg oral daily. - Recheck cholesterol levels at next visit.  Vitamin D  deficiency Vitamin D  levels previously low normal. Re-evaluation needed. - Order vitamin D  level test. - Continue Cholecalciferol 1000 units oral daily until lab results are reviewed.   Return in about 3 months (around 11/21/2023) for chronic care management.SABRA Carrol Aurora, NP

## 2023-08-21 NOTE — Patient Instructions (Signed)
 Stop by the lab prior to leaving today. I will notify you of your results once received.   I recommend you start taking Calcium  1200 mg with Vitamin D  800 units everyday.  Weight bearing exercise is also very important in bone strength, make sure to exercise regularly.   Let me know if you change your mind about the medication or the injection.   It was a pleasure to see you today!

## 2023-08-22 ENCOUNTER — Ambulatory Visit: Payer: Self-pay | Admitting: General Practice

## 2023-10-17 ENCOUNTER — Telehealth: Payer: Self-pay

## 2023-10-17 NOTE — Telephone Encounter (Signed)
 Patient called, left VM to return the call to the office.   Copied from CRM [id number]. Topic: Clinical - Medication Question >> Oct 17, 2023 10:20 AM Alfonso HERO wrote: Reason for CRM: patient calling to ask some questions about your medications

## 2023-10-17 NOTE — Telephone Encounter (Signed)
 LMTCB

## 2023-10-18 NOTE — Telephone Encounter (Signed)
 Spoke with patient and she was calling to let us  know she started a multivitamin as she could not swallow the big vitamin d  pills the pharmacy had. Also, patient asked about her lab results from August 2025 and I relayed those as well to her. Patient verbalized understanding and thanked me for the call back.

## 2023-10-18 NOTE — Telephone Encounter (Signed)
 Patient notified of lab results and verbalized understanding.

## 2023-10-18 NOTE — Telephone Encounter (Unsigned)
 Copied from CRM [id number]. Topic: General - Other >> Oct 17, 2023  5:14 PM Nessti S wrote: Reason for CRM: patient was returning call. Will like a call back in the morning if possible call back number [phone]

## 2023-11-21 ENCOUNTER — Ambulatory Visit (INDEPENDENT_AMBULATORY_CARE_PROVIDER_SITE_OTHER): Admitting: General Practice

## 2023-11-21 ENCOUNTER — Encounter: Payer: Self-pay | Admitting: General Practice

## 2023-11-21 VITALS — BP 160/74 | HR 75 | Temp 97.6°F | Ht 58.9 in | Wt 127.5 lb

## 2023-11-21 DIAGNOSIS — R21 Rash and other nonspecific skin eruption: Secondary | ICD-10-CM

## 2023-11-21 DIAGNOSIS — E7800 Pure hypercholesterolemia, unspecified: Secondary | ICD-10-CM

## 2023-11-21 DIAGNOSIS — I10 Essential (primary) hypertension: Secondary | ICD-10-CM | POA: Diagnosis not present

## 2023-11-21 DIAGNOSIS — M810 Age-related osteoporosis without current pathological fracture: Secondary | ICD-10-CM

## 2023-11-21 DIAGNOSIS — E876 Hypokalemia: Secondary | ICD-10-CM

## 2023-11-21 LAB — BASIC METABOLIC PANEL WITH GFR
BUN: 23 mg/dL (ref 6–23)
CO2: 32 meq/L (ref 19–32)
Calcium: 9.4 mg/dL (ref 8.4–10.5)
Chloride: 103 meq/L (ref 96–112)
Creatinine, Ser: 0.81 mg/dL (ref 0.40–1.20)
GFR: 65.3 mL/min (ref >60.00)
Glucose, Bld: 94 mg/dL (ref 70–99)
Potassium: 4 meq/L (ref 3.5–5.1)
Sodium: 140 meq/L (ref 135–145)

## 2023-11-21 MED ORDER — PREDNISONE 20 MG PO TABS: 40.0000 mg | ORAL_TABLET | Freq: Every day | ORAL | 0 refills | Status: AC

## 2023-11-21 NOTE — Progress Notes (Signed)
 Established Patient Office Visit  Subjective   Patient ID: Kathy Morgan, female    DOB: 04/16/1934  Age: 89 y.o. MRN: [id number]  Chief Complaint  Patient presents with   Medical Management of Chronic Issues    3 Month f/u    HPI  Kathy Morgan is a 89 year old female with past medical history of HTN, HLD, vertigo, vitamin d  deficiency, osteoporosis present today for chronic care management.   Discussed the use of AI scribe software for clinical note transcription with the patient, who gave verbal consent to proceed.  History of Present Illness Kathy Morgan is an 89 year old female who presents for a routine follow-up visit.  She has a history of hypertension, hyperlipidemia, and osteoporosis. Her current medications include amlodipine  5 mg daily for hypertension, atorvastatin  10 mg daily for hyperlipidemia, metoprolol  50 mg daily, potassium supplements, and vitamin D . She tolerates these medications well without any missed doses.  She reports a new rash present for approximately three weeks. The rash is itchy but not painful and lacks drainage. She has applied Vaseline and peroxide to keep it moist. She suspects it might be related to perfume use but is unsure. No recent changes in detergents or other potential allergens. No pain associated with the rash.  She is making efforts to improve her diet by increasing protein intake, having an egg and yogurt for breakfast. She is also drinking more water.  No headaches, blurred vision, or dizziness. She notes a slight decrease in balance, attributing it to aging. She has not been checking her blood pressure at home.    Patient Active Problem List   Diagnosis Date Noted   Age-related osteoporosis without current pathological fracture 08/21/2023   Vitamin D  deficiency 05/21/2023   History of hyperglycemia 05/21/2023   Establishing care with new doctor, encounter for 05/21/2023   Hallux valgus of right foot 07/12/2020   Post-COVID  chronic cough 06/24/2020   Neck muscle spasm 02/26/2020   Sprain of right rotator cuff capsule 09/09/2019   Vertigo 09/09/2019   Hypertension    Hyperlipidemia    Arthritis 04/08/2013   Past Medical History:  Diagnosis Date   Arthritis    lower back   Hyperlipidemia    Hypertension    Vertigo    Wears dentures    full upper   Past Surgical History:  Procedure Laterality Date   BREAST SURGERY Right 01/01/2009   R cyst removal (benign)    CATARACT EXTRACTION W/PHACO Right 01/05/2019   Procedure: CATARACT EXTRACTION PHACO AND INTRAOCULAR LENS PLACEMENT (IOC) RIGHT 2.88  00:29.7;  Surgeon: Myrna Adine Anes, MD;  Location: [facility];  Service: Ophthalmology;  Laterality: Right;   CATARACT EXTRACTION W/PHACO Left 02/23/2019   Procedure: CATARACT EXTRACTION PHACO AND INTRAOCULAR LENS PLACEMENT (IOC) LEFT 4.68  00:41.3;  Surgeon: Myrna Adine Anes, MD;  Location: [facility];  Service: Ophthalmology;  Laterality: Left;  needs to stay 2nd   VAGINAL HYSTERECTOMY  01/01/1989   No Known Allergies       11/21/2023   11:08 AM 08/21/2023   11:39 AM 07/31/2023   10:19 AM  Depression screen PHQ 2/9  Decreased Interest 0 0 0  Down, Depressed, Hopeless 0 0 0  PHQ - 2 Score 0 0 0  Altered sleeping 0 1   Tired, decreased energy 0 0   Change in appetite 0 0   Feeling bad or failure about yourself  0 0   Trouble concentrating 0 0   Moving  slowly or fidgety/restless 0 0   Suicidal thoughts 0 0   PHQ-9 Score 0 1    Difficult doing work/chores Not difficult at all Not difficult at all      Data saved with a previous flowsheet row definition       11/21/2023   11:09 AM 08/21/2023   11:39 AM 05/21/2023   12:37 PM  GAD 7 : Generalized Anxiety Score  Nervous, Anxious, on Edge 0 0 0  Control/stop worrying 0 1 0  Worry too much - different things 0 0 0  Trouble relaxing 0 0 0  Restless 0 0 0  Easily annoyed or irritable 1 0 0  Afraid - awful might happen 0 1 0  Total  GAD 7 Score 1 2 0  Anxiety Difficulty  Not difficult at all Not difficult at all      Review of Systems  Constitutional:  Negative for chills and fever.  Respiratory:  Negative for shortness of breath.   Cardiovascular:  Negative for chest pain.  Gastrointestinal:  Negative for abdominal pain, constipation, diarrhea, heartburn, nausea and vomiting.  Genitourinary:  Negative for dysuria, frequency and urgency.  Skin:  Positive for itching and rash.  Neurological:  Negative for dizziness and headaches.  Endo/Heme/Allergies:  Negative for polydipsia.  Psychiatric/Behavioral:  Negative for depression and suicidal ideas. The patient is not nervous/anxious.       Objective:     BP (!) 150/82 (BP Location: Left Arm, Patient Position: Sitting, Cuff Size: Normal)   Pulse 75   Temp 97.6 F (36.4 C) (Oral)   Ht 4' 10.9 (1.496 m)   Wt 127 lb 8 oz (57.8 kg)   SpO2 98%   BMI 25.84 kg/m  BP Readings from Last 3 Encounters:  11/21/23 (!) 150/82  08/21/23 120/64  07/31/23 118/68   Wt Readings from Last 3 Encounters:  11/21/23 127 lb 8 oz (57.8 kg)  08/21/23 126 lb (57.2 kg)  07/31/23 126 lb 12.8 oz (57.5 kg)      Physical Exam Vitals and nursing note reviewed.  Constitutional:      Appearance: Normal appearance.  Cardiovascular:     Rate and Rhythm: Normal rate and regular rhythm.     Pulses: Normal pulses.     Heart sounds: Normal heart sounds.  Pulmonary:     Effort: Pulmonary effort is normal.     Breath sounds: Normal breath sounds.  Skin:    Findings: Rash present.      Neurological:     Mental Status: She is alert and oriented to person, place, and time.  Psychiatric:        Mood and Affect: Mood normal.        Behavior: Behavior normal.        Thought Content: Thought content normal.        Judgment: Judgment normal.      No results found for any visits on 11/21/23.     The ASCVD Risk score (Arnett DK, et al., 2019) failed to calculate for the following  reasons:   The 2019 ASCVD risk score is only valid for ages 71 to 31    Assessment & Plan:  Primary hypertension -     Basic metabolic panel with GFR  Pure hypercholesterolemia  Hypokalemia  Age-related osteoporosis without current pathological fracture  Rash -     predniSONE ; Take 2 tablets (40 mg total) by mouth daily for 5 days.  Dispense: 10 tablet; Refill: 0   Assessment  and Plan Assessment & Plan Essential hypertension Blood pressure managed with amlodipine  and metoprolol . Possible anxiety-related elevation during visit. - elevated on both readings. Advised to monitor BP at home. - Continue amlodipine  5 mg oral daily. - Continue metoprolol  succinate ER 50 mg oral daily. - Monitor for symptoms such as headaches or blurred vision.  Pure hypercholesterolemia Cholesterol managed with atorvastatin . No adherence issues or side effects. - Continue atorvastatin  10 mg oral daily.  Age-related osteoporosis without current pathological fracture Osteoporosis managed with calcium  and vitamin D . No new fractures. - Continue calcium  1200 mg with vitamin D  800 units oral daily.  Vitamin D  deficiency Managed with supplementation. Current regimen effective. - Continue cholecalciferol 1000 units oral daily.  Hypokalemia Potassium supplementation ongoing. - Continue potassium chloride  20 mEq oral daily.  Rash, possibly post-herpetic or allergic Rash present for three weeks, itchy but not painful. Differential includes post-herpetic or allergic reaction. No drainage or pain. Possible stress or perfume exposure. - Apply Benadryl cream or hydrocortisone cream for itching. - Prescribed prednisone 2 tablets daily for 5 days to reduce inflammation. - Monitor rash and report if no improvement.     Return in about 3 months (around 02/21/2024) for chronic care management.SABRA Carrol Aurora, NP

## 2023-11-21 NOTE — Patient Instructions (Addendum)
 Stop by the lab prior to leaving today. I will notify you of your results once received.   Continue medications as prescribed.   Start prednisone 20 mg tablets. Take 2 tablets by mouth once daily in the morning for 5 days.  Use benadryl or hydrocortisone cream for itching. This is available over the counter.   Follow up in 3 months.   It was a pleasure to see you today!

## 2023-11-22 ENCOUNTER — Telehealth: Payer: Self-pay

## 2023-11-22 ENCOUNTER — Ambulatory Visit: Payer: Self-pay | Admitting: General Practice

## 2023-11-22 NOTE — Telephone Encounter (Signed)
 Copied from CRM [id number]. Topic: Clinical - Lab/Test Results >> Nov 22, 2023  2:31 PM Viola F wrote: Reason for CRM: Patient returned call regarding lab work, I relayed Bableen's message and she had no further questions

## 2023-11-22 NOTE — Telephone Encounter (Signed)
 Copied from CRM #[id number]. Topic: Clinical - Medical Advice >> Nov 22, 2023  8:17 AM Kathy Morgan wrote: Reason for CRM: Patient called wants to know if she should take predniSONE  (DELTASONE ) 20 MG tablet with other medications. Patient take all medications in the morning so she is waiting to hear from Morgan nurse

## 2023-11-25 NOTE — Telephone Encounter (Signed)
 LMCTB; Please provide message from provider/office when call is returned from patient.

## 2023-11-27 NOTE — Telephone Encounter (Signed)
 Message given to patient and she has been taking all her medications since starting the prednisone .

## 2023-12-16 ENCOUNTER — Ambulatory Visit: Payer: Self-pay | Admitting: General Practice

## 2023-12-16 ENCOUNTER — Ambulatory Visit: Admitting: General Practice

## 2023-12-16 ENCOUNTER — Encounter: Payer: Self-pay | Admitting: General Practice

## 2023-12-16 VITALS — BP 136/70 | HR 86 | Temp 97.2°F | Ht 58.9 in | Wt 128.8 lb

## 2023-12-16 DIAGNOSIS — I10 Essential (primary) hypertension: Secondary | ICD-10-CM

## 2023-12-16 DIAGNOSIS — R21 Rash and other nonspecific skin eruption: Secondary | ICD-10-CM

## 2023-12-16 DIAGNOSIS — R200 Anesthesia of skin: Secondary | ICD-10-CM | POA: Diagnosis not present

## 2023-12-16 DIAGNOSIS — E559 Vitamin D deficiency, unspecified: Secondary | ICD-10-CM

## 2023-12-16 DIAGNOSIS — R202 Paresthesia of skin: Secondary | ICD-10-CM | POA: Insufficient documentation

## 2023-12-16 DIAGNOSIS — Z8639 Personal history of other endocrine, nutritional and metabolic disease: Secondary | ICD-10-CM

## 2023-12-16 DIAGNOSIS — E7800 Pure hypercholesterolemia, unspecified: Secondary | ICD-10-CM

## 2023-12-16 LAB — CBC
HCT: 39.8 % (ref 36.0–46.0)
Hemoglobin: 13.4 g/dL (ref 12.0–15.0)
MCHC: 33.7 g/dL (ref 30.0–36.0)
MCV: 93.8 fl (ref 78.0–100.0)
Platelets: 147 K/uL — ABNORMAL LOW (ref 150.0–400.0)
RBC: 4.24 Mil/uL (ref 3.87–5.11)
RDW: 14 % (ref 11.5–15.5)
WBC: 5.7 K/uL (ref 4.0–10.5)

## 2023-12-16 LAB — VITAMIN B12: Vitamin B-12: 411 pg/mL (ref 211–911)

## 2023-12-16 LAB — HEMOGLOBIN A1C: Hgb A1c MFr Bld: 5.2 % (ref 4.6–6.5)

## 2023-12-16 MED ORDER — HYDROCORTISONE 1 % EX CREA: 1.0000 | TOPICAL_CREAM | Freq: Two times a day (BID) | CUTANEOUS | 0 refills | Status: AC

## 2023-12-16 NOTE — Progress Notes (Signed)
 Established Patient Office Visit  Subjective   Patient ID: Kathy Morgan, female    DOB: August 16, 1934  Age: 89 y.o. MRN: [id number]  Chief Complaint  Patient presents with   Rash    On chest x couple months. Patient took prednisone  that was prescribed but states its still very itchy.     Rash Pertinent negatives include no diarrhea, fever, shortness of breath or vomiting.    Kathy Morgan is a 89 year old female with past medical history of HTN, arthritis, osteoporosis, HLD, vertigo, vitamin d  deficiency presents today for an acute visit to discuss rash.   Discussed the use of AI scribe software for clinical note transcription with the patient, who gave verbal consent to proceed.  History of Present Illness Kathy Morgan is an 89 year old female who presents with a persistent rash and tingling in her fingers.  She has a persistent rash that initially improved with prednisone  but then recurred with intense itching. The itching has since slowed down. She did not receive a topical treatment initially but purchased an over-the-counter product, the name of which she cannot recall. The rash is described as less prickly and crackly.  She experiences tingling in her fingers, primarily in the left hand, which occurs intermittently without associated numbness or pain. The tingling may have started around the time she began taking prednisone . No history of diabetes, with her last check in May showing normal results. She is currently taking vitamin D  as recommended and reports feeling more energetic, though she sometimes has difficulty falling asleep, which she attributes to prednisone  use.    Patient Active Problem List   Diagnosis Date Noted   Tingling 12/16/2023   Rash 11/21/2023   Hypokalemia 11/21/2023   Age-related osteoporosis without current pathological fracture 08/21/2023   Vitamin D  deficiency 05/21/2023   History of hyperglycemia 05/21/2023   Establishing care with new doctor,  encounter for 05/21/2023   Hallux valgus of right foot 07/12/2020   Post-COVID chronic cough 06/24/2020   Neck muscle spasm 02/26/2020   Sprain of right rotator cuff capsule 09/09/2019   Vertigo 09/09/2019   Hypertension    Hyperlipidemia    Arthritis 04/08/2013   Past Medical History:  Diagnosis Date   Arthritis    lower back   Hyperlipidemia    Hypertension    Vertigo    Wears dentures    full upper   Past Surgical History:  Procedure Laterality Date   BREAST SURGERY Right 01/01/2009   R cyst removal (benign)    CATARACT EXTRACTION W/PHACO Right 01/05/2019   Procedure: CATARACT EXTRACTION PHACO AND INTRAOCULAR LENS PLACEMENT (IOC) RIGHT 2.88  00:29.7;  Surgeon: Myrna Adine Anes, MD;  Location: [facility];  Service: Ophthalmology;  Laterality: Right;   CATARACT EXTRACTION W/PHACO Left 02/23/2019   Procedure: CATARACT EXTRACTION PHACO AND INTRAOCULAR LENS PLACEMENT (IOC) LEFT 4.68  00:41.3;  Surgeon: Myrna Adine Anes, MD;  Location: [facility];  Service: Ophthalmology;  Laterality: Left;  needs to stay 2nd   VAGINAL HYSTERECTOMY  01/01/1989   Allergies[1]       11/21/2023   11:08 AM 08/21/2023   11:39 AM 07/31/2023   10:19 AM  Depression screen PHQ 2/9  Decreased Interest 0 0 0  Down, Depressed, Hopeless 0 0 0  PHQ - 2 Score 0 0 0  Altered sleeping 0 1   Tired, decreased energy 0 0   Change in appetite 0 0   Feeling bad or failure about yourself  0 0   Trouble concentrating 0 0   Moving slowly or fidgety/restless 0 0   Suicidal thoughts 0 0   PHQ-9 Score 0 1    Difficult doing work/chores Not difficult at all Not difficult at all      Data saved with a previous flowsheet row definition       11/21/2023   11:09 AM 08/21/2023   11:39 AM 05/21/2023   12:37 PM  GAD 7 : Generalized Anxiety Score  Nervous, Anxious, on Edge 0 0 0  Control/stop worrying 0 1 0  Worry too much - different things 0 0 0  Trouble relaxing 0 0 0  Restless 0 0 0   Easily annoyed or irritable 1 0 0  Afraid - awful might happen 0 1 0  Total GAD 7 Score 1 2 0  Anxiety Difficulty  Not difficult at all Not difficult at all      Review of Systems  Constitutional:  Negative for chills and fever.  Respiratory:  Negative for shortness of breath.   Cardiovascular:  Negative for chest pain.  Gastrointestinal:  Negative for abdominal pain, constipation, diarrhea, heartburn, nausea and vomiting.  Genitourinary:  Negative for dysuria, frequency and urgency.  Skin:  Positive for rash.  Neurological:  Negative for dizziness and headaches.  Endo/Heme/Allergies:  Negative for polydipsia.  Psychiatric/Behavioral:  Negative for depression and suicidal ideas. The patient is not nervous/anxious.       Objective:     BP 136/70   Pulse 86   Temp (!) 97.2 F (36.2 C) (Temporal)   Ht 4' 10.9 (1.496 m)   Wt 128 lb 12.8 oz (58.4 kg)   SpO2 97%   BMI 26.10 kg/m  BP Readings from Last 3 Encounters:  12/16/23 136/70  11/21/23 (!) 160/74  08/21/23 120/64   Wt Readings from Last 3 Encounters:  12/16/23 128 lb 12.8 oz (58.4 kg)  11/21/23 127 lb 8 oz (57.8 kg)  08/21/23 126 lb (57.2 kg)      Physical Exam Vitals and nursing note reviewed.  Constitutional:      Appearance: Normal appearance.  Cardiovascular:     Rate and Rhythm: Normal rate and regular rhythm.     Pulses: Normal pulses.     Heart sounds: Normal heart sounds.  Pulmonary:     Effort: Pulmonary effort is normal.     Breath sounds: Normal breath sounds.  Neurological:     Mental Status: She is alert and oriented to person, place, and time.  Psychiatric:        Mood and Affect: Mood normal.        Behavior: Behavior normal.        Thought Content: Thought content normal.        Judgment: Judgment normal.      No results found for any visits on 12/16/23.     The ASCVD Risk score (Arnett DK, et al., 2019) failed to calculate for the following reasons:   The 2019 ASCVD risk score  is only valid for ages 76 to 35   * - Cholesterol units were assumed    Assessment & Plan:  Rash -     Hydrocortisone ; Apply 1 Application topically 2 (two) times daily.  Dispense: 30 g; Refill: 0  Numbness and tingling -     Vitamin B12 -     CBC  Primary hypertension  Pure hypercholesterolemia  History of hyperglycemia -     Hemoglobin A1c  Vitamin D   deficiency    Assessment and Plan Assessment & Plan Rash Improved with prednisone  but persists with itching. Rash is less prickly and crackly, though still warm to touch. - Prescribed hydrocortisone  cream for itching, apply twice daily as needed.  Paresthesia of the hands Intermittent tingling in the left hand, possibly related to vitamin B12 deficiency or blood pressure fluctuations. No numbness or pain reported. Previous diabetes screening was normal. - Ordered lab tests to check vitamin B12 levels and recheck A1c to rule out diabetes. - Will follow up with lab results via MyChart or phone message.  Primary hypertension Blood pressure is well-controlled with current medication regimen. - Continue current antihypertensive medications.  Vitamin D  deficiency Vitamin D  levels were checked in August and were not low. She reports feeling better with vitamin D  supplementation. - Continue current vitamin D  supplementation.   Return if symptoms worsen or fail to improve.    Carrol Aurora, NP     [1] No Known Allergies

## 2023-12-16 NOTE — Patient Instructions (Addendum)
 Stop by the lab prior to leaving today. I will notify you of your results once received.   Start hydrocortisone  cream for itching.   I will be in touch regarding the lab results.   Follow up in February.   It was a pleasure to see you today!

## 2023-12-20 NOTE — Telephone Encounter (Signed)
 Copied from CRM #[id number]. Topic: Clinical - Lab/Test Results >> Dec 18, 2023  8:27 AM Aleatha C wrote: Reason for CRM: Call for blood work results on 12/15 relayed to patient message that was left >> Dec 20, 2023  8:42 AM Ivette P wrote: Pt calledin regarding a message from [place]. Pt has already been read results.

## 2023-12-20 NOTE — Telephone Encounter (Signed)
 Kathy Morgan

## 2023-12-20 NOTE — Telephone Encounter (Signed)
 Sending note to [place] pool.

## 2024-01-03 ENCOUNTER — Emergency Department

## 2024-01-03 ENCOUNTER — Emergency Department
Admission: EM | Admit: 2024-01-03 | Discharge: 2024-01-03 | Disposition: A | Discharge status: Home / Self Care | Attending: Emergency Medicine | Admitting: Emergency Medicine

## 2024-01-03 ENCOUNTER — Encounter: Payer: Self-pay | Admitting: Emergency Medicine

## 2024-01-03 ENCOUNTER — Other Ambulatory Visit: Payer: Self-pay

## 2024-01-03 DIAGNOSIS — R519 Headache, unspecified: Secondary | ICD-10-CM | POA: Diagnosis present

## 2024-01-03 DIAGNOSIS — I10 Essential (primary) hypertension: Secondary | ICD-10-CM | POA: Diagnosis not present

## 2024-01-03 NOTE — ED Triage Notes (Addendum)
 C/o HA that started this AM. Did not take any OTC medicine for it. Denies trauma, falls. Denies vision changes, CP, SOB, and dizziness. Reports unsteady balance for multiple months and has not seen provider about it as well. Pt reports her HA is now gone but worried because she never has HA's. GCS 15. Ambulatory in triage. PMH: HTN

## 2024-01-03 NOTE — ED Provider Notes (Signed)
 "  [facility] Provider Note    Event Date/Time   First MD Initiated Contact with Patient 01/03/24 [id number]     (approximate)   History   Headache   HPI  Kathy Morgan is a 89 y.o. female   presents to the ED with complaint of headache.  Patient reports that she has had a headache off and on for 1 week but headache started again this morning.  Patient denies any change in vision, dizziness, nausea, vomiting, fever, chills or muscle aches.  Patient reports she has had some difficulties with her balance for several months but has not seen a provider for this.  Currently she reports that the headache is completely gone.  Patient has history of hypertension, vertigo, vitamin D  deficiency.      Physical Exam   Triage Vital Signs: ED Triage Vitals  Encounter Vitals Group     BP 01/03/24 0854 136/88     Girls Systolic BP Percentile --      Girls Diastolic BP Percentile --      Boys Systolic BP Percentile --      Boys Diastolic BP Percentile --      Pulse Rate 01/03/24 0854 89     Resp 01/03/24 0854 18     Temp 01/03/24 0854 97.8 F (36.6 C)     Temp Source 01/03/24 0854 Oral     SpO2 01/03/24 0854 100 %     Weight 01/03/24 0856 119 lb (54 kg)     Height 01/03/24 0856 5' 1 (1.549 m)     Head Circumference --      Peak Flow --      Pain Score 01/03/24 0854 1     Pain Loc --      Pain Education --      Exclude from Growth Chart --     Most recent vital signs: Vitals:   01/03/24 0854  BP: 136/88  Pulse: 89  Resp: 18  Temp: 97.8 F (36.6 C)  SpO2: 100%     General: Awake, no distress.  Alert, oriented, talkative, able to answer questions without any difficulty. CV:  Good peripheral perfusion.  Heart rate and rate rhythm. Resp:  Normal effort.  Lungs are clear bilaterally. Abd:  No distention.  Other:  PERRLA, EOMI's, speech is normal, cranial nerves II through XII grossly intact, good muscle strength bilaterally.  Patient is able to stand and  ambulate without any assistance.   ED Results / Procedures / Treatments   Labs (all labs ordered are listed, but only abnormal results are displayed) Labs Reviewed - No data to display    RADIOLOGY CT head without contrast per radiology is negative for acute intracranial changes.  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  White matter noted bilaterally compatible with chronic microvascular ischemia disease.   PROCEDURES:  Critical Care performed:   Procedures   MEDICATIONS ORDERED IN ED: Medications - No data to display   IMPRESSION / MDM / ASSESSMENT AND PLAN / ED COURSE  I reviewed the triage vital signs and the nursing notes.   Differential diagnosis includes, but is not limited to, headache generalized, intermittent headaches, space-occupying lesion, subdural, subarachnoid, intermittent vertigo.  89 year old female presents to the ED with intermittent headaches for approximately 1 week and a history of ongoing intermittent dizziness.  Patient experienced a headache this morning which is now completely resolved.  Patient has an appointment with her PCP but has never said anything about dizziness  to her provider.  Patient was reassured with CT being negative for acute changes.  She strongly encouraged to talk to her PCP about dizziness and possibly a referral to [facility] for evaluation.  Patient was ambulatory in the hallway without any assistance during her time in the emergency department.  She has to follow-up with her PCP or return to the emergency department if any severe worsening of her symptoms or urgent concerns.      Patient's presentation is most consistent with acute complicated illness / injury requiring diagnostic workup.  FINAL CLINICAL IMPRESSION(S) / ED DIAGNOSES   Final diagnoses:  Headache disorder     Rx / DC Orders   ED Discharge Orders     None        Note:  This document was prepared using Dragon voice recognition software and  may include unintentional dictation errors.   Saunders Shona CROME, PA-C 01/03/24 1413    Bradler, Evan K, MD 01/04/24 [phone]  "

## 2024-01-03 NOTE — Discharge Instructions (Signed)
 Call and make an appointment to see your primary care provider if any continued headaches before your scheduled appointment next month.  Continue with your regular medications.  Also ask about your vitamin D  level at your doctor's office.

## 2024-02-21 ENCOUNTER — Ambulatory Visit: Admitting: General Practice

## 2024-07-31 ENCOUNTER — Ambulatory Visit
